# Patient Record
Sex: Male | Born: 1965 | Race: White | Hispanic: No | Marital: Married | Smoking: Former smoker
Health system: Southern US, Community
[De-identification: ages and names within clinical notes are randomized; demographics above are authoritative.]

## PROBLEM LIST (undated history)

## (undated) DIAGNOSIS — I10 Essential (primary) hypertension: Secondary | ICD-10-CM

## (undated) DIAGNOSIS — K5790 Diverticulosis of intestine, part unspecified, without perforation or abscess without bleeding: Secondary | ICD-10-CM

## (undated) DIAGNOSIS — E785 Hyperlipidemia, unspecified: Secondary | ICD-10-CM

## (undated) DIAGNOSIS — D369 Benign neoplasm, unspecified site: Secondary | ICD-10-CM

## (undated) DIAGNOSIS — T7840XA Allergy, unspecified, initial encounter: Secondary | ICD-10-CM

## (undated) DIAGNOSIS — K219 Gastro-esophageal reflux disease without esophagitis: Secondary | ICD-10-CM

## (undated) HISTORY — DX: Diverticulosis of intestine, part unspecified, without perforation or abscess without bleeding: K57.90

## (undated) HISTORY — DX: Allergy, unspecified, initial encounter: T78.40XA

## (undated) HISTORY — DX: Gastro-esophageal reflux disease without esophagitis: K21.9

## (undated) HISTORY — PX: WISDOM TOOTH EXTRACTION: SHX21

## (undated) HISTORY — DX: Hyperlipidemia, unspecified: E78.5

## (undated) HISTORY — DX: Benign neoplasm, unspecified site: D36.9

## (undated) HISTORY — DX: Essential (primary) hypertension: I10

---

## 1999-05-14 HISTORY — PX: NASAL SINUS SURGERY: SHX719

## 2000-05-26 ENCOUNTER — Encounter (INDEPENDENT_AMBULATORY_CARE_PROVIDER_SITE_OTHER): Payer: Self-pay

## 2000-05-26 ENCOUNTER — Other Ambulatory Visit: Admission: RE | Admit: 2000-05-26 | Discharge: 2000-05-26 | Payer: Self-pay | Admitting: Otolaryngology

## 2003-05-14 HISTORY — PX: BIOPSY SALIVARY GLAND: PRO30

## 2004-05-13 HISTORY — PX: OTHER SURGICAL HISTORY: SHX169

## 2004-05-16 ENCOUNTER — Ambulatory Visit: Payer: Self-pay | Admitting: Internal Medicine

## 2004-05-17 ENCOUNTER — Ambulatory Visit: Payer: Self-pay | Admitting: Internal Medicine

## 2004-05-28 ENCOUNTER — Ambulatory Visit: Payer: Self-pay

## 2004-11-01 ENCOUNTER — Ambulatory Visit: Payer: Self-pay | Admitting: Internal Medicine

## 2004-11-16 ENCOUNTER — Ambulatory Visit: Payer: Self-pay | Admitting: Internal Medicine

## 2004-11-23 ENCOUNTER — Ambulatory Visit: Payer: Self-pay | Admitting: Internal Medicine

## 2005-02-25 ENCOUNTER — Ambulatory Visit: Payer: Self-pay | Admitting: Internal Medicine

## 2005-12-11 ENCOUNTER — Ambulatory Visit: Payer: Self-pay | Admitting: Internal Medicine

## 2006-01-29 ENCOUNTER — Ambulatory Visit: Payer: Self-pay | Admitting: Internal Medicine

## 2006-05-13 DIAGNOSIS — D369 Benign neoplasm, unspecified site: Secondary | ICD-10-CM

## 2006-05-13 HISTORY — DX: Benign neoplasm, unspecified site: D36.9

## 2006-05-13 HISTORY — PX: COLONOSCOPY W/ POLYPECTOMY: SHX1380

## 2006-07-03 ENCOUNTER — Emergency Department (HOSPITAL_COMMUNITY): Admission: EM | Admit: 2006-07-03 | Discharge: 2006-07-03 | Payer: Self-pay | Admitting: Emergency Medicine

## 2006-07-18 ENCOUNTER — Ambulatory Visit: Payer: Self-pay | Admitting: Internal Medicine

## 2006-07-25 ENCOUNTER — Ambulatory Visit: Payer: Self-pay | Admitting: Internal Medicine

## 2007-03-02 DIAGNOSIS — K219 Gastro-esophageal reflux disease without esophagitis: Secondary | ICD-10-CM | POA: Insufficient documentation

## 2007-03-03 ENCOUNTER — Ambulatory Visit: Payer: Self-pay | Admitting: Internal Medicine

## 2007-03-03 DIAGNOSIS — K219 Gastro-esophageal reflux disease without esophagitis: Secondary | ICD-10-CM | POA: Insufficient documentation

## 2007-03-03 DIAGNOSIS — R0989 Other specified symptoms and signs involving the circulatory and respiratory systems: Secondary | ICD-10-CM

## 2007-03-03 DIAGNOSIS — R0609 Other forms of dyspnea: Secondary | ICD-10-CM

## 2007-03-04 ENCOUNTER — Encounter (INDEPENDENT_AMBULATORY_CARE_PROVIDER_SITE_OTHER): Payer: Self-pay | Admitting: *Deleted

## 2007-03-11 ENCOUNTER — Ambulatory Visit: Payer: Self-pay | Admitting: Internal Medicine

## 2007-03-30 ENCOUNTER — Ambulatory Visit: Payer: Self-pay | Admitting: Internal Medicine

## 2007-05-13 ENCOUNTER — Ambulatory Visit: Payer: Self-pay | Admitting: Internal Medicine

## 2007-05-21 ENCOUNTER — Ambulatory Visit: Payer: Self-pay | Admitting: Internal Medicine

## 2007-05-21 ENCOUNTER — Encounter: Payer: Self-pay | Admitting: Internal Medicine

## 2007-05-21 DIAGNOSIS — D126 Benign neoplasm of colon, unspecified: Secondary | ICD-10-CM | POA: Insufficient documentation

## 2007-05-21 DIAGNOSIS — K5730 Diverticulosis of large intestine without perforation or abscess without bleeding: Secondary | ICD-10-CM | POA: Insufficient documentation

## 2007-06-24 DIAGNOSIS — E8881 Metabolic syndrome: Secondary | ICD-10-CM | POA: Insufficient documentation

## 2007-08-04 ENCOUNTER — Telehealth (INDEPENDENT_AMBULATORY_CARE_PROVIDER_SITE_OTHER): Payer: Self-pay | Admitting: *Deleted

## 2007-08-18 ENCOUNTER — Telehealth (INDEPENDENT_AMBULATORY_CARE_PROVIDER_SITE_OTHER): Payer: Self-pay | Admitting: *Deleted

## 2008-07-13 ENCOUNTER — Telehealth (INDEPENDENT_AMBULATORY_CARE_PROVIDER_SITE_OTHER): Payer: Self-pay | Admitting: *Deleted

## 2008-07-14 ENCOUNTER — Telehealth (INDEPENDENT_AMBULATORY_CARE_PROVIDER_SITE_OTHER): Payer: Self-pay | Admitting: *Deleted

## 2008-09-21 ENCOUNTER — Encounter: Payer: Self-pay | Admitting: Internal Medicine

## 2008-10-26 ENCOUNTER — Ambulatory Visit: Payer: Self-pay | Admitting: Internal Medicine

## 2008-10-26 DIAGNOSIS — M109 Gout, unspecified: Secondary | ICD-10-CM

## 2008-10-26 DIAGNOSIS — E785 Hyperlipidemia, unspecified: Secondary | ICD-10-CM

## 2008-11-02 ENCOUNTER — Telehealth (INDEPENDENT_AMBULATORY_CARE_PROVIDER_SITE_OTHER): Payer: Self-pay | Admitting: *Deleted

## 2008-11-02 ENCOUNTER — Encounter (INDEPENDENT_AMBULATORY_CARE_PROVIDER_SITE_OTHER): Payer: Self-pay | Admitting: *Deleted

## 2008-11-07 ENCOUNTER — Ambulatory Visit: Payer: Self-pay | Admitting: Internal Medicine

## 2008-11-07 DIAGNOSIS — D649 Anemia, unspecified: Secondary | ICD-10-CM

## 2008-11-07 DIAGNOSIS — E119 Type 2 diabetes mellitus without complications: Secondary | ICD-10-CM

## 2008-12-01 ENCOUNTER — Telehealth (INDEPENDENT_AMBULATORY_CARE_PROVIDER_SITE_OTHER): Payer: Self-pay | Admitting: *Deleted

## 2009-01-06 ENCOUNTER — Ambulatory Visit: Payer: Self-pay | Admitting: Internal Medicine

## 2009-01-17 ENCOUNTER — Encounter (INDEPENDENT_AMBULATORY_CARE_PROVIDER_SITE_OTHER): Payer: Self-pay | Admitting: *Deleted

## 2009-01-17 LAB — CONVERTED CEMR LAB
Basophils Absolute: 0 10*3/uL (ref 0.0–0.1)
Cholesterol: 221 mg/dL — ABNORMAL HIGH (ref 0–200)
Direct LDL: 154.7 mg/dL
Eosinophils Absolute: 0.1 10*3/uL (ref 0.0–0.7)
HDL: 31 mg/dL — ABNORMAL LOW (ref >39.00)
Hgb A1c MFr Bld: 5.6 % (ref 4.6–6.5)
Lymphs Abs: 2.1 10*3/uL (ref 0.7–4.0)
Monocytes Absolute: 0.7 10*3/uL (ref 0.1–1.0)
Monocytes Relative: 11 % (ref 3.0–12.0)
RBC: 5.7 M/uL (ref 4.22–5.81)
Saturation Ratios: 28.9 % (ref 20.0–50.0)
Total CHOL/HDL Ratio: 7
VLDL: 59.4 mg/dL — ABNORMAL HIGH (ref 0.0–40.0)

## 2009-09-18 ENCOUNTER — Ambulatory Visit: Payer: Self-pay | Admitting: Internal Medicine

## 2009-09-19 LAB — CONVERTED CEMR LAB
BUN: 15 mg/dL (ref 6–23)
Creatinine, Ser: 1.2 mg/dL (ref 0.4–1.5)
Creatinine,U: 85.8 mg/dL
Direct LDL: 141.8 mg/dL
Microalb Creat Ratio: 0.3 mg/g (ref 0.0–30.0)
Microalb, Ur: 0.3 mg/dL (ref 0.0–1.9)
Total CHOL/HDL Ratio: 7

## 2009-10-20 ENCOUNTER — Telehealth (INDEPENDENT_AMBULATORY_CARE_PROVIDER_SITE_OTHER): Payer: Self-pay | Admitting: *Deleted

## 2010-06-10 LAB — CONVERTED CEMR LAB
Basophils Absolute: 0 10*3/uL (ref 0.0–0.1)
Bilirubin Urine: NEGATIVE
Bilirubin, Direct: 0 mg/dL (ref 0.0–0.3)
CO2: 27 meq/L (ref 19–32)
Calcium: 9.2 mg/dL (ref 8.4–10.5)
Chloride: 100 meq/L (ref 96–112)
Creatinine, Ser: 1.2 mg/dL (ref 0.4–1.5)
Eosinophils Absolute: 0.1 10*3/uL (ref 0.0–0.7)
Eosinophils Relative: 1.2 % (ref 0.0–5.0)
Eosinophils Relative: 2.4 % (ref 0.0–5.0)
Hgb A1c MFr Bld: 6.8 % — ABNORMAL HIGH (ref 4.6–6.5)
Ketones, urine, test strip: NEGATIVE
Lymphocytes Relative: 26.8 % (ref 12.0–46.0)
Lymphs Abs: 1.3 10*3/uL (ref 0.7–4.0)
MCHC: 34.2 g/dL (ref 30.0–36.0)
MCV: 87.9 fL (ref 78.0–100.0)
MCV: 91.2 fL (ref 78.0–100.0)
Monocytes Absolute: 0.4 10*3/uL (ref 0.1–1.0)
Monocytes Absolute: 0.6 10*3/uL (ref 0.2–0.7)
Monocytes Relative: 9.6 % (ref 3.0–11.0)
Neutro Abs: 3.1 10*3/uL (ref 1.4–7.7)
Neutrophils Relative %: 63.3 % (ref 43.0–77.0)
Platelets: 257 10*3/uL (ref 150.0–400.0)
Potassium: 3.9 meq/L (ref 3.5–5.1)
Protein, U semiquant: NEGATIVE
RBC: 4.19 M/uL — ABNORMAL LOW (ref 4.22–5.81)
RDW: 12.1 % (ref 11.5–14.6)
RDW: 14.3 % (ref 11.5–14.6)
TSH: 0.6 microintl units/mL (ref 0.35–5.50)
Total Protein: 7.4 g/dL (ref 6.0–8.3)
Uric Acid, Serum: 10.3 mg/dL — ABNORMAL HIGH (ref 4.0–7.8)
WBC Urine, dipstick: NEGATIVE
WBC: 4.9 10*3/uL (ref 4.5–10.5)

## 2010-06-12 NOTE — Assessment & Plan Note (Signed)
Summary: gout flare-up//lch   Vital Signs:  Patient profile:   45 year old male Weight:      250.6 pounds BMI:     34.11 Temp:     97.7 degrees F oral Pulse rate:   72 / minute Resp:     16 per minute BP sitting:   116 / 78  (left arm) Cuff size:   large  Vitals Entered By: Shonna Chock (Sep 18, 2009 8:03 AM) CC: Gout Flare-Up since last week, gout attacks coming frequently.  Fasting for any labs to be done Comments REVIEWED MED LIST, PATIENT AGREED DOSE AND INSTRUCTION CORRECT    CC:  Gout Flare-Up since last week and gout attacks coming frequently.  Fasting for any labs to be done.  History of Present Illness: Recurrent gout flares treated by Podiatrist with Daypro. Old chart reviewed : in 2002 weight was 186 & Triglycerides 98; weight now 250.6  . High Fructose Corn Syrup & uric acid  physiology reviewed . Uric acid was 9.9 &  Triglycerides were 297 in 12/2008. A1c was 6.8% in  12/2008. Glucoses not  being checked   Allergies (verified): No Known Drug Allergies  Review of Systems MS:  Complains of joint pain, joint redness, and joint swelling; L  great  toe invoved now.  Physical Exam  General:  well-nourished; alert,appropriate and cooperative throughout examination Pulses:  R and L dorsalis pedis and posterior tibial pulses are full and equal bilaterally Extremities:  No clubbing, cyanosis.L great toe slightly erythematous & tender. L foot swollen slightly. Mild chronic R great toenail changes Skin:  see foot   Impression & Recommendations:  Problem # 1:  GOUT (ICD-274.9)  Orders: Venipuncture (21308) TLB-Uric Acid, Blood (84550-URIC)  His updated medication list for this problem includes:    Colcrys 0.6 Mg Tabs (Colchicine) .Marland Kitchen... 1 two times a day until gout gone  Problem # 2:  HYPERLIPIDEMIA (ICD-272.4)  Orders: TLB-TSH (Thyroid Stimulating Hormone) (84443-TSH) TLB-Lipid Panel (80061-LIPID)  Problem # 3:  DM (ICD-250.00)  Orders: Venipuncture  (65784) TLB-Creatinine, Blood (82565-CREA) TLB-Potassium (K+) (84132-K) TLB-Uric Acid, Blood (84550-URIC) TLB-BUN (Urea Nitrogen) (84520-BUN) TLB-Microalbumin/Creat Ratio, Urine (82043-MALB) TLB-A1C / Hgb A1C (Glycohemoglobin) (83036-A1C)  Complete Medication List: 1)  Multivitamins Tabs (Multiple vitamin) .... Take 1 tablet by mouth once a day 2)  Fish Oil  .... As directed 3)  Red Yeast Rice  .... As directed 4)  Zyrtec Allergy 10 Mg Tabs (Cetirizine hcl) .Marland Kitchen.. 1 by mouth once daily as needed 5)  Colcrys 0.6 Mg Tabs (Colchicine) .Marland Kitchen.. 1 two times a day until gout gone  Patient Instructions: 1)  Check your blood sugars regularly. If your readings are usually above : 150 or below 90 you should contact our office. Two hrs post meal goal = < 180, ideally < 160. 2)  See your eye doctor yearly to check for diabetic eye damage. 3)  Check your feet each night for sore areas, calluses or signs of infection. Prescriptions: COLCRYS 0.6 MG TABS (COLCHICINE) 1 two times a day until gout gone  #30 x 0   Entered and Authorized by:   Marga Melnick MD   Signed by:   Marga Melnick MD on 09/18/2009   Method used:   Faxed to ...       [facility]  [address] [phone]* (retail)       [address]       [facility] [place], [place]  [address]       Ph: [phone] or [phone]       Fax: [phone]   RxID:   [id number]

## 2010-06-12 NOTE — Progress Notes (Signed)
Summary: Refill Request  Phone Note Refill Request Message from:  Patient on October 20, 2009 10:18 AM  Refills Requested: Medication #1:  COLCRYS 0.6 MG TABS 1 two times a day until gout gone   Dosage confirmed as above?Dosage Confirmed   Supply Requested: 1 month [facility] [address]  Next Appointment Scheduled: none Initial call taken by: Harold Barban,  October 20, 2009 10:18 AM    Prescriptions: COLCRYS 0.6 MG TABS (COLCHICINE) 1 two times a day until gout gone  #30 x 0   Entered by:   Shonna Chock   Authorized by:   Marga Melnick MD   Signed by:   Shonna Chock on 10/20/2009   Method used:   Electronically to        [address] [phone]* (retail)       [address]       [place], [place]  [address]       Ph: [phone] or [phone]       Fax: [phone]   RxID:   [id number]

## 2010-08-27 ENCOUNTER — Encounter: Payer: Self-pay | Admitting: Internal Medicine

## 2010-08-27 ENCOUNTER — Ambulatory Visit (INDEPENDENT_AMBULATORY_CARE_PROVIDER_SITE_OTHER): Payer: BLUE CROSS/BLUE SHIELD | Admitting: Internal Medicine

## 2010-08-27 ENCOUNTER — Ambulatory Visit: Payer: Self-pay | Admitting: Internal Medicine

## 2010-08-27 VITALS — BP 148/80 | HR 72 | Temp 98.4°F | Wt 243.0 lb

## 2010-08-27 DIAGNOSIS — B372 Candidiasis of skin and nail: Secondary | ICD-10-CM

## 2010-08-27 DIAGNOSIS — E8881 Metabolic syndrome: Secondary | ICD-10-CM

## 2010-08-27 MED ORDER — KETOCONAZOLE 2 % EX CREA: TOPICAL_CREAM | Freq: Two times a day (BID) | CUTANEOUS | Status: AC

## 2010-08-27 NOTE — Patient Instructions (Signed)
Keep the tissue as dry as possible. Apply the Nizoral cream twice a day and gently blow dry with a hairdryer.

## 2010-08-27 NOTE — Progress Notes (Signed)
  Subjective:    Patient ID: Stephen Randolph, male    DOB: 03/25/1966, 45 y.o.   MRN: [id number]  HPI RASH  Location: on glans penis Onset: 04/13  Course: / improving Self-treated with: no Rx             Improvement with treatment: N/A  History Pruritis: yes, intermittently  Tenderness: yes, with showering  New medications/antibiotics: no  Tick/insect/pet exposure: no  Recent travel: no  New detergent, new clothing, or other topical exposure: no   Red Flags Feeling ill: no  Fever: no  Mouth lesions: no  Facial/tongue swelling/difficulty breathing:  no  Diabetic or immunocompromised: yes, not checking Glucoses until last night  , 72 @10 :30 pm ; 87 this am      Review of Systems no iritis,  dysuria, pyuria, or hematuria, significant arthralgias     Objective:   Physical Exam he is in no acute distress.  He has no inguinal lymphadenopathy. There is a tiny granuloma in the area of the left epididymis; otherwise the scrotal exams unremarkable. There is suggestion of a candidal rash over the head of the penis.          Assessment & Plan:  #1 candidal dermatitis  #2 metabolic syndrome, rule out frank diabetes  Plan #1 topical Nizoral will be prescribed one to 2 times a day.   .  #2 A1c.

## 2010-08-28 LAB — HEMOGLOBIN A1C: Hgb A1c MFr Bld: 5.8 % (ref 4.6–6.5)

## 2010-09-25 NOTE — Assessment & Plan Note (Signed)
[facility]                         GASTROENTEROLOGY OFFICE NOTE   Stephen Randolph, Stephen Randolph               MRN:          [id number]  DATE:03/30/2007                            DOB:          [birth year]    REFERRING PHYSICIAN:  Titus Dubin. Alwyn Ren, MD,FACP,FCCP   REASON FOR CONSULTATION:  Screening colonoscopy.   HISTORY:  This is a pleasant 45 year old white male with no significant  past medical history.  He presents today regarding screening  colonoscopy.  The patient's GI history is remarkable for  gastroesophageal reflux disease.  He underwent upper endoscopy with Dr.  Russella Dar in 1994.  He was found to have grade 1 esophagitis.  For his  reflux disease, he takes Prilosec OTC 20 mg daily.  On medication, his  symptoms are well controlled.  No dysphagia.  Currently without lower GI  complaints.  No abdominal pain, change in bowel habits, or hematochezia.  His weight has been stable.  He has never had prior colonoscopy.  he has  a strong family history of colon cancer in both his father and paternal  grandfather.   PAST MEDICAL HISTORY:  1. History of dyslipidemia.  Diet controlled.  2. Osteoarthritis.  3. Anxiety.  4. Status post sinus surgery.   ALLERGIES:  No known drug allergies.   CURRENT MEDICATIONS:  1. Prozac 20 mg daily.  2. Prilosec OTC 20 mg daily/p.r.n.   FAMILY HISTORY:  Father with colon cancer at age 60.  Paternal  grandfather also with colon cancer, requiring colostomy.   SOCIAL HISTORY:  The patient is divorced, with a 69 year old son and a  53 year old Museum/gallery conservator.  He is a Engineer, agricultural.  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  Does not smoke.  Uses  alcohol socially.   REVIEW OF SYSTEMS:  Per diagnostic evaluation performed.   PHYSICAL EXAMINATION:  GENERAL:  Well-appearing male, in no acute  distress.  VITAL SIGNS:  Blood pressure 152/82, heart rate 88 and regular, weight  243.4  pounds.  He is 6 feet in height.  HEENT:  Sclerae are anicteric.  Conjunctivae are pink.  Oral mucosa is  intact.  NECK:  There is no adenopathy.  LUNGS:  Clear.  HEART:  Regular.  ABDOMEN:  Soft, without tenderness, mass, or hernia.  Good bowel sounds.   IMPRESSION:  1. Gastroesophageal reflux disease, with a history of mild      esophagitis.  Currently asymptomatic on Prilosec.  2. Family history of colon cancer in first and second-degree      relatives.  He is an appropriate candidate for colonoscopy without      contraindication.   RECOMMENDATIONS:  1. Continue Prilosec.  2. Colonoscopy with polypectomy if necessary.  The nature of the      procedure, as well as the risks, benefits, and alternatives, have      been reviewed.  He understood and agreed to proceed.  3. Ongoing general medical care with Dr. Alwyn Ren.     Wilhemina Bonito. Marina Goodell, MD  Electronically Signed    JNP/MedQ  DD: 03/30/2007  DT: 03/31/2007  Job #: [id number]  cc:   Titus Dubin. Alwyn Ren, MD,FACP,FCCP

## 2010-09-28 NOTE — Assessment & Plan Note (Signed)
[facility]                        [place] OFFICE NOTE   DETRELL, UMSCHEID               MRN:          [id number]  DATE:07/18/2006                            DOB:          1965-10-30    Stephen Randolph was seen July 18, 2006, for followup evaluation,  shortness of breath and chest pressure.  He had been seen in the urgent  care and then taken by EMS to the hospital.  Those hospital records were  reviewed.  Electrocardiogram and chest x-ray were negative.  Cardiac  enzymes, D-dimer and chemistries were also normal.   There was a minimal irregularity along the right anterior fourth rib,  suggestion of an old injury or fracture.  Lab abnormalities included a  hemoglobin of 17.3 and a potassium of 3.2.   In reviewing the history, he denies headaches or diarrhea with the chest  pain.  He has had occasional flushing.   He feels that the symptoms are related to anxiety.  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   He was given Xanax by his wife and took one half pill on two occasions  with response.   FAMILY HISTORY:  Does include anxiety in an aunt and his mother.   He is on fish oil, red yeast rice and multivitamins.  He does have  dyslipidemia and risk and interventions have been discussed.   Weight is 221, down 9 pounds.  O2 sats were 95 and 96% on room air.  Pulse was 76 and regular.  Respiratory rate was 15 and unlabored.  Blood  pressure 126/80.  He does have some plethora of the face, which suggests  solar changes.   There were no carotid bruits.   He has an S4 with slurring, but no significant murmurs.   Chest was clear to auscultation.   Thyroid is normal to palpation.   Deep tendon reflexes are normal.   He is oriented times three and affect is appropriate.  There are no  neuropsychiatric deficits.   The physiology of neurotransmitters  and their impact were reviewed.  It  was recommended that he take fluoxetine 10 mg daily, increasing this to  20 if there is suboptimal response.   He is not on a diuretic.  It is recommended that he use no salt to  season food and increase his citrus intake because of his potassium of  3.2.   His hemoglobin was 17.6 on January 29, 2006.  A ferritin level will be  checked to rule out hemochromatosis.   He will also be given a urine collection kit.  Should he have additional  symptoms, then a 24-hour urine for catecholamines and metanephrines will  be collected.   Buspirone 15 mg one half to one twice a day as needed would also be  recommended.  This would not be associated with the addiction potential  of alprazolam.   He will be re-evaluated after four to six weeks of these interventions.     Titus Dubin. Alwyn Ren, MD,FACP,FCCP  Electronically Signed    [facility]/MedQ  DD: 07/18/2006  DT: 07/18/2006  Job #: [id number]

## 2010-11-08 ENCOUNTER — Ambulatory Visit (INDEPENDENT_AMBULATORY_CARE_PROVIDER_SITE_OTHER): Payer: BC Managed Care – PPO | Admitting: Internal Medicine

## 2010-11-08 ENCOUNTER — Encounter: Payer: Self-pay | Admitting: Internal Medicine

## 2010-11-08 DIAGNOSIS — Z0000 Encounter for general adult medical examination without abnormal findings: Secondary | ICD-10-CM

## 2010-11-08 DIAGNOSIS — E785 Hyperlipidemia, unspecified: Secondary | ICD-10-CM

## 2010-11-08 DIAGNOSIS — E8881 Metabolic syndrome: Secondary | ICD-10-CM

## 2010-11-08 DIAGNOSIS — R7309 Other abnormal glucose: Secondary | ICD-10-CM

## 2010-11-08 NOTE — Progress Notes (Signed)
Subjective:    Patient ID: Stephen Randolph, male    DOB: July 02, 1965, 45 y.o.   MRN: [id number]  HPI  Stephen Randolph  is here for a physical; he has no acute issues except for recent ?allergy related headaches. Allegra helps.      Review of Systems Patient reports no  vision/ hearing changes,anorexia, weight change, fever ,adenopathy, persistant / recurrent hoarseness, swallowing issues, chest pain,palpitations, edema,persistant / recurrent cough, hemoptysis, dyspnea(rest, exertional, paroxysmal nocturnal), gastrointestinal  bleeding (melena, rectal bleeding), abdominal pain, excessive heart burn, GU symptoms( dysuria, hematuria, pyuria, voiding/incontinence  issues) syncope, focal weakness, memory loss, skin/hair/nail changes,depression, anxiety, abnormal bruising/bleeding, musculoskeletal symptoms/signs.  Headaches are  R frontal with temporal radiation. Snoring has been described w/o apnea. Occasional am numbness of hands     Objective:   Physical Exam Gen.: Healthy and well-nourished in appearance. Alert, appropriate and cooperative throughout exam. Head: Normocephalic without obvious abnormalities;  no alopecia  Eyes: No corneal or conjunctival inflammation noted. Pupils equal round reactive to light and accommodation. Fundal exam is benign without hemorrhages, exudate, papilledema. Extraocular motion intact. Vision grossly normal. Ears: External  ear exam reveals no significant lesions or deformities. Wax R canal  L .TM  normal. Hearing is grossly normal bilaterally. Nose: External nasal exam reveals no deformity or inflammation. Nasal mucosa are pink and moist. No lesions or exudates noted. Septum normal Mouth: Oral mucosa and oropharynx reveal no lesions or exudates. Teeth in good repair. Neck: No deformities, masses, or tenderness noted. Range of motion & . Thyroid normal. Lungs: Normal respiratory effort; chest expands symmetrically. Lungs are clear to auscultation without rales,  wheezes, or increased work of breathing. Heart: Normal rate and rhythm. Normal S1 and S2. No gallop, click, or rub. No  murmur. Abdomen: Bowel sounds normal; abdomen soft and nontender. No masses, organomegaly or hernias noted. Genitalia/ DRE:  Normal.                                                                                 Musculoskeletal/extremities: No deformity or scoliosis noted of  the thoracic or lumbar spine. No clubbing, cyanosis, edema, or deformity noted. Range of motion  normal .Tone & strength  normal.Joints normal. Nail health  good. Vascular: Carotid, radial artery, dorsalis pedis and dorsalis posterior tibial pulses are full and equal. No bruits present. Neurologic: Alert and oriented x3. Deep tendon reflexes symmetrical and normal.          Skin: Intact without suspicious lesions or rashes. Tiny skin tag perineum. Lymph: No cervical, axillary, or inguinal lymphadenopathy present. Psych: Mood and affect are normal. Normally interactive                                                                                         Assessment & Plan:  #1 comprehensive physical exam; no acute findings #  2 see Problem List with Assessments & Recommendations Plan: see Orders

## 2010-11-08 NOTE — Patient Instructions (Signed)
Preventive Health Care: Exercise at least 30-45 minutes a day,  3-4 days a week.  Eat a low-fat diet with lots of fruits and vegetables, up to 7-9 servings per day. Avoid obesity; your goal is waist measurement < 40 inches.Consume less than 40 grams of sugar per day from foods & drinks with High Fructose Corn Sugar as #1, 2,3 or # 4 on label. Eye Doctor - have an eye exam @ least annually.                                                        Health Care Power of Attorney & Living Will. Complete if not in place ; these place you in charge of your health care decisions.

## 2010-11-28 ENCOUNTER — Encounter: Payer: Self-pay | Admitting: Internal Medicine

## 2011-09-12 ENCOUNTER — Ambulatory Visit (INDEPENDENT_AMBULATORY_CARE_PROVIDER_SITE_OTHER): Payer: BC Managed Care – PPO | Admitting: Internal Medicine

## 2011-09-12 ENCOUNTER — Encounter: Payer: Self-pay | Admitting: Internal Medicine

## 2011-09-12 DIAGNOSIS — M109 Gout, unspecified: Secondary | ICD-10-CM

## 2011-09-12 DIAGNOSIS — E790 Hyperuricemia without signs of inflammatory arthritis and tophaceous disease: Secondary | ICD-10-CM

## 2011-09-12 DIAGNOSIS — R7989 Other specified abnormal findings of blood chemistry: Secondary | ICD-10-CM

## 2011-09-12 MED ORDER — FEBUXOSTAT 40 MG PO TABS: 40.0000 mg | ORAL_TABLET | Freq: Every day | ORAL | Status: DC

## 2011-09-12 NOTE — Progress Notes (Signed)
  Subjective:    Patient ID: Stephen Randolph, male    DOB: [birth year], 46 y.o.   MRN: [id number]  HPI  He has had intermittent gout attacks over the past 3 years. He had a significant episode one month ago which lasted approximately 2 weeks. Daypro was of no benefit but  Uloric  samples seem to help. He did not take colchicine for that episode but did take it in January. He did not take colchicine because he feared long-term complications.  He states that he is restricting high fructose corn syrup sugar in his diet. He is not on hydrochlorothiazide    Review of Systems He has essentially stopped drinking as part of his gout prevention program. He avoids shellfish and organ meats.     Objective:   Physical Exam   He is healthy and well-nourished in appearance and in no acute distress.  Joints reveal no erythema or swelling. He has full range of motion. Tone and strength are good. He does have some crepitus of his knees.  Pedal pulses are intact. There are no skin changes. He has some chronic changes of the right great toenail        Assessment & Plan:  #1 gout, quiescent. Uloric prophylaxis appears appropriate. Uric acid has been drawn.

## 2011-09-12 NOTE — Patient Instructions (Signed)
The most common cause of elevated uric acid is the ingestion of sugar from high fructose corn syrup sources added to processed foods & drinks.  Eat a low-fat diet with lots of fruits and vegetables, up to 7-9 servings per day. Consume less than 40 (preferably ZERO) grams of sugar per day from foods & drinks with High Fructose Corn Syrup (HFCS) sugar as #1,2,3 or # 4 on label.Whole Foods, [employer] & [employer] do not carry products with HFCS. Follow a  low carb nutrition program such as [place] or The New Sugar Busters  to prevent Diabetes progression . White carbohydrates (potatoes, rice, bread, and pasta) have a high spike of sugar and a high load of sugar. For example a  baked potato has a cup of sugar and a  french fry  2 teaspoons of sugar. Yams, wild  rice, whole grained bread &  wheat pasta have been much lower spike and load of  sugar. Portions should be the size of a deck of cards or your palm.  Please try to go on My Chart within the next 24 hours to allow me to release the results directly to you.

## 2011-09-13 LAB — URIC ACID: Uric Acid, Serum: 6.2 mg/dL (ref 4.0–7.8)

## 2011-10-11 ENCOUNTER — Other Ambulatory Visit: Payer: Self-pay

## 2011-10-11 MED ORDER — FEBUXOSTAT 40 MG PO TABS: 40.0000 mg | ORAL_TABLET | Freq: Every day | ORAL | Status: DC

## 2011-10-11 MED ORDER — ALLOPURINOL 100 MG PO TABS: 100.0000 mg | ORAL_TABLET | Freq: Every day | ORAL | Status: DC

## 2011-10-11 NOTE — Telephone Encounter (Signed)
This patient had never been on Allopurinol in the past, please advise on the dose. Thank You    KP

## 2011-10-11 NOTE — Telephone Encounter (Signed)
Ok for 30 of each w/ 1 refill

## 2011-10-11 NOTE — Telephone Encounter (Signed)
Call from patient and he stated he has been taking the Uloric or his gout by was only given a 1 mo supply of samples. He wanted to know if he could get a script sent to the pharmacy and also a script for Allopurinol to compare the prices. Please advise      KP

## 2011-10-11 NOTE — Telephone Encounter (Signed)
Allopurinol 100mg  daily to start- Dr Laury Axon will increase as needed

## 2011-10-11 NOTE — Telephone Encounter (Signed)
Patient made aware that both medications were sent.     KP

## 2011-12-09 ENCOUNTER — Other Ambulatory Visit: Payer: Self-pay | Admitting: Family Medicine

## 2011-12-13 ENCOUNTER — Encounter: Payer: BC Managed Care – PPO | Admitting: Internal Medicine

## 2012-03-05 ENCOUNTER — Encounter: Payer: Self-pay | Admitting: Internal Medicine

## 2012-03-05 ENCOUNTER — Ambulatory Visit (INDEPENDENT_AMBULATORY_CARE_PROVIDER_SITE_OTHER): Payer: BC Managed Care – PPO | Admitting: Internal Medicine

## 2012-03-05 VITALS — BP 122/80 | HR 79 | Temp 97.9°F | Resp 12 | Ht 72.5 in | Wt 250.0 lb

## 2012-03-05 DIAGNOSIS — E785 Hyperlipidemia, unspecified: Secondary | ICD-10-CM

## 2012-03-05 DIAGNOSIS — Z0000 Encounter for general adult medical examination without abnormal findings: Secondary | ICD-10-CM

## 2012-03-05 DIAGNOSIS — J310 Chronic rhinitis: Secondary | ICD-10-CM

## 2012-03-05 DIAGNOSIS — M109 Gout, unspecified: Secondary | ICD-10-CM

## 2012-03-05 DIAGNOSIS — H109 Unspecified conjunctivitis: Secondary | ICD-10-CM

## 2012-03-05 DIAGNOSIS — D126 Benign neoplasm of colon, unspecified: Secondary | ICD-10-CM

## 2012-03-05 DIAGNOSIS — R9431 Abnormal electrocardiogram [ECG] [EKG]: Secondary | ICD-10-CM

## 2012-03-05 LAB — CBC WITH DIFFERENTIAL/PLATELET
Eosinophils Relative: 1.1 % (ref 0.0–5.0)
HCT: 52.7 % — ABNORMAL HIGH (ref 39.0–52.0)
Hemoglobin: 17.8 g/dL — ABNORMAL HIGH (ref 13.0–17.0)
Lymphocytes Relative: 27.4 % (ref 12.0–46.0)
MCHC: 33.8 g/dL (ref 30.0–36.0)
MCV: 90.1 fl (ref 78.0–100.0)
Monocytes Absolute: 0.6 10*3/uL (ref 0.1–1.0)
Neutro Abs: 3.4 10*3/uL (ref 1.4–7.7)
Neutrophils Relative %: 59.9 % (ref 43.0–77.0)

## 2012-03-05 NOTE — Progress Notes (Signed)
  Subjective:    Patient ID: Stephen Randolph, male    DOB: 02-19-1966, 46 y.o.   MRN: [id number]  HPI  Stephen Randolph is here for a physical;acute issues relate to gout. His orthopedist has increased his allopurinol on attempt to control musculoskeletal symptoms attributed to chronic gout. Past medical history/family history/social history were all reviewed and updated.       Review of Systems He is not on a regular exercise program because of recent gout-related issues. Diet consists of smaller portions and avoidance of high fructose corn syrup. He does have dyspnea and high levels of exercise; he denies chest pain, palpitations, or claudication. He monitors his sugars irregularly ; random sugars have been 75-80.     Objective:   Physical Exam Gen.: Weight excess centrally. Alert, appropriate and cooperative throughout exam. Head: Normocephalic without obvious abnormalities  Eyes: No corneal or conjunctival inflammation noted. Pupils equal round reactive to light and accommodation. Fundal exam is benign without hemorrhages, exudate, papilledema. Extraocular motion intact. Vision grossly normal. Ears: External  ear exam reveals no significant lesions or deformities. Canals : wax bilaterally. Hearing is grossly normal bilaterally. Nose: External nasal exam reveals no deformity or inflammation. Nasal mucosa are pink and moist. No lesions or exudates noted.  Mouth: Oral mucosa and oropharynx reveal no lesions or exudates. Teeth in good repair. Neck: No deformities, masses, or tenderness noted. Range of motion & Thyroid normal. Lungs: Normal respiratory effort; chest expands symmetrically. Lungs are clear to auscultation without rales, wheezes, or increased work of breathing. Heart: Normal rate and rhythm. Normal S1 and S2. No gallop, click, or rub. Grade 1/2 over 6 systolic murmur . Abdomen: Bowel sounds normal; abdomen soft and nontender. No masses, organomegaly or hernias  noted. Genitalia/DRE: Genitalia normal . Prostate is normal without enlargement, asymmetry, nodularity, or induration.                                    Musculoskeletal/extremities: No deformity or scoliosis noted of  the thoracic or lumbar spine. No clubbing, cyanosis, edema, or deformity noted. Range of motion  normal .Tone & strength  normal.Joints normal. Nail health  good. Vascular: Carotid, radial artery, dorsalis pedis and  posterior tibial pulses are full and equal. No bruits present. Neurologic: Alert and oriented x3. Deep tendon reflexes symmetrical and normal.          Skin: Intact without suspicious lesions or rashes. Lymph: No cervical, axillary, or inguinal lymphadenopathy present. Psych: Mood and affect are normal. Normally interactive                                                                                         Assessment & Plan:  #1 comprehensive physical exam; no acute findings  Plan: see Orders

## 2012-03-05 NOTE — Patient Instructions (Addendum)
Preventive Health Care: Exercise at least 30-45 minutes a day,  3-4 days a week.  Eat a low-fat diet with lots of fruits and vegetables, up to 7-9 servings per day.  Consume less than 40 grams of sugar per day from foods & drinks with High Fructose Corn Sugar as #1,2,3 or # 4 on label. As per the Standard of Care , screening Colonoscopy recommended this year. Eye Doctor - have an eye exam @ least annually.                                                         Health Care Power of Attorney & Living Will. Complete if not in place ; these place you in charge of your health care decisions. Take the EKG to any emergency room or preop visits. There are nonspecific changes; as long as there is no new change these are not clinically significant.  If you activate My Chart; the results can be released to you as soon as they populate from the lab. If you choose not to use this program; the labs have to be reviewed, copied & mailed   causing a delay in getting the results to you.

## 2012-03-06 LAB — HEPATIC FUNCTION PANEL
AST: 69 U/L — ABNORMAL HIGH (ref 0–37)
Albumin: 4.4 g/dL (ref 3.5–5.2)
Total Bilirubin: 1 mg/dL (ref 0.3–1.2)
Total Protein: 7.8 g/dL (ref 6.0–8.3)

## 2012-03-06 LAB — BASIC METABOLIC PANEL
CO2: 23 mEq/L (ref 19–32)
Calcium: 9.4 mg/dL (ref 8.4–10.5)
Creatinine, Ser: 1.3 mg/dL (ref 0.4–1.5)
GFR: 62.29 mL/min (ref >60.00)
Glucose, Bld: 96 mg/dL (ref 70–99)
Potassium: 3.9 mEq/L (ref 3.5–5.1)
Sodium: 138 mEq/L (ref 135–145)

## 2012-03-06 LAB — MICROALBUMIN / CREATININE URINE RATIO
Creatinine,U: 43 mg/dL
Microalb, Ur: 1.3 mg/dL (ref 0.0–1.9)

## 2012-03-06 LAB — TSH: TSH: 0.82 u[IU]/mL (ref 0.35–5.50)

## 2012-03-06 LAB — LIPID PANEL
Cholesterol: 229 mg/dL — ABNORMAL HIGH (ref 0–200)
Triglycerides: 227 mg/dL — ABNORMAL HIGH (ref 0.0–149.0)

## 2012-03-06 LAB — URIC ACID: Uric Acid, Serum: 9.5 mg/dL — ABNORMAL HIGH (ref 4.0–7.8)

## 2012-03-06 LAB — LDL CHOLESTEROL, DIRECT: Direct LDL: 171.3 mg/dL

## 2012-06-22 ENCOUNTER — Encounter: Payer: Self-pay | Admitting: Internal Medicine

## 2012-11-19 ENCOUNTER — Encounter: Payer: Self-pay | Admitting: Internal Medicine

## 2013-05-13 HISTORY — PX: COLONOSCOPY: SHX174

## 2013-07-29 ENCOUNTER — Encounter: Payer: Self-pay | Admitting: Internal Medicine

## 2013-08-02 ENCOUNTER — Encounter: Payer: Self-pay | Admitting: Internal Medicine

## 2013-08-02 ENCOUNTER — Ambulatory Visit (INDEPENDENT_AMBULATORY_CARE_PROVIDER_SITE_OTHER): Payer: 59 | Admitting: Internal Medicine

## 2013-08-02 VITALS — BP 140/90 | HR 91 | Temp 97.4°F | Resp 14 | Ht 71.5 in | Wt 247.4 lb

## 2013-08-02 DIAGNOSIS — E785 Hyperlipidemia, unspecified: Secondary | ICD-10-CM

## 2013-08-02 DIAGNOSIS — Z0000 Encounter for general adult medical examination without abnormal findings: Secondary | ICD-10-CM

## 2013-08-02 DIAGNOSIS — E669 Obesity, unspecified: Secondary | ICD-10-CM | POA: Insufficient documentation

## 2013-08-02 DIAGNOSIS — M109 Gout, unspecified: Secondary | ICD-10-CM

## 2013-08-02 NOTE — Patient Instructions (Signed)

## 2013-08-02 NOTE — Progress Notes (Signed)
   Subjective:    Patient ID: Stephen Randolph, male    DOB: [birth year], 48 y.o.   MRN: [id number]  HPI  He is here for a physical;acute issues include gout attack 1 month ago.Allopurinol 300 mg restarted after Colcrys course.     Review of Systems  A heart healthy diet is followed; exercise is irregular. .  Family history is + for premature coronary disease in MGF Advanced cholesterol testing reveals  LDL goal is less than 100; ideally <70. Presently not on statin.  Low dose ASA not taken Specifically denied are  chest pain, palpitations, dyspnea, or claudication.      Objective:   Physical Exam Gen.: Healthy and well-nourished in appearance. Alert, appropriate and cooperative throughout exam. Head: Normocephalic without obvious abnormalities; no alopecia  Eyes: No corneal or conjunctival inflammation noted. Pupils equal round reactive to light and accommodation. Extraocular motion intact.  Ears: External  ear exam reveals no significant lesions or deformities. Canals clear .TMs normal. Hearing is grossly normal bilaterally. Nose: External nasal exam reveals no deformity or inflammation. Nasal mucosa are pink and moist. No lesions or exudates noted.   Mouth: Oral mucosa and oropharynx reveal no lesions or exudates. Teeth in good repair. Neck: No deformities, masses, or tenderness noted. Range of motion & Thyroid normal. Lungs: Normal respiratory effort; chest expands symmetrically. Lungs are clear to auscultation without rales, wheezes, or increased work of breathing. Heart: Normal rate and rhythm. Normal S1 and S2. No gallop, click, or rub. No murmur. Abdomen: Bowel sounds normal; abdomen soft and nontender. No masses, organomegaly or hernias noted. Genitalia: Genitalia normal except for left varices. Prostate is normal without enlargement, asymmetry, nodularity, or induration                                   Musculoskeletal/extremities: No deformity or scoliosis noted of  the  thoracic or lumbar spine.No clubbing, cyanosis, edema, or significant extremity  deformity noted. Range of motion normal .Tone & strength normal. Hand joints normal.  Fingernail health good. Able to lie down & sit up w/o help. Negative SLR bilaterally Vascular: Carotid, radial artery, dorsalis pedis and  posterior tibial pulses are full and equal. No bruits present. Neurologic: Alert and oriented x3. Deep tendon reflexes symmetrical and normal.  Gait normal        Skin: Intact without suspicious lesions or rashes. Lymph: No cervical, axillary, or inguinal lymphadenopathy present. Psych: Mood and affect are normal. Normally interactive                                                                                        Assessment & Plan:  #1 comprehensive physical exam; no acute findings  Plan: see Orders  & Recommendations

## 2013-08-02 NOTE — Progress Notes (Signed)
Pre visit review using our clinic review tool, if applicable. No additional management support is needed unless otherwise documented below in the visit note. 

## 2013-08-03 ENCOUNTER — Other Ambulatory Visit (INDEPENDENT_AMBULATORY_CARE_PROVIDER_SITE_OTHER): Payer: 59

## 2013-08-03 DIAGNOSIS — Z0000 Encounter for general adult medical examination without abnormal findings: Secondary | ICD-10-CM

## 2013-08-03 LAB — BASIC METABOLIC PANEL
BUN: 15 mg/dL (ref 6–23)
CHLORIDE: 100 meq/L (ref 96–112)
CO2: 29 meq/L (ref 19–32)
CREATININE: 1.3 mg/dL (ref 0.4–1.5)
Calcium: 9.4 mg/dL (ref 8.4–10.5)
GFR: 64.14 mL/min (ref >60.00)
GLUCOSE: 109 mg/dL — AB (ref 70–99)
POTASSIUM: 4 meq/L (ref 3.5–5.1)
Sodium: 137 mEq/L (ref 135–145)

## 2013-08-03 LAB — LIPID PANEL
CHOLESTEROL: 190 mg/dL (ref 0–200)
HDL: 34.5 mg/dL — AB (ref >39.00)
LDL Cholesterol: 114 mg/dL — ABNORMAL HIGH (ref 0–99)
TRIGLYCERIDES: 206 mg/dL — AB (ref 0.0–149.0)
Total CHOL/HDL Ratio: 6
VLDL: 41.2 mg/dL — AB (ref 0.0–40.0)

## 2013-08-03 LAB — CBC WITH DIFFERENTIAL/PLATELET
Basophils Absolute: 0 10*3/uL (ref 0.0–0.1)
Basophils Relative: 0.7 % (ref 0.0–3.0)
EOS ABS: 0.1 10*3/uL (ref 0.0–0.7)
Eosinophils Relative: 2.5 % (ref 0.0–5.0)
HEMATOCRIT: 51.1 % (ref 39.0–52.0)
Hemoglobin: 17.3 g/dL — ABNORMAL HIGH (ref 13.0–17.0)
Lymphocytes Relative: 32.9 % (ref 12.0–46.0)
Lymphs Abs: 2 10*3/uL (ref 0.7–4.0)
MCHC: 33.9 g/dL (ref 30.0–36.0)
MCV: 90.1 fl (ref 78.0–100.0)
MONO ABS: 0.6 10*3/uL (ref 0.1–1.0)
Monocytes Relative: 9.5 % (ref 3.0–12.0)
NEUTROS PCT: 54.4 % (ref 43.0–77.0)
Neutro Abs: 3.3 10*3/uL (ref 1.4–7.7)
PLATELETS: 317 10*3/uL (ref 150.0–400.0)
RBC: 5.67 Mil/uL (ref 4.22–5.81)
RDW: 12.8 % (ref 11.5–14.6)
WBC: 6 10*3/uL (ref 4.5–10.5)

## 2013-08-03 LAB — URIC ACID: Uric Acid, Serum: 5 mg/dL (ref 4.0–7.8)

## 2013-08-03 LAB — HEPATIC FUNCTION PANEL
ALBUMIN: 4.4 g/dL (ref 3.5–5.2)
ALT: 59 U/L — ABNORMAL HIGH (ref 0–53)
AST: 41 U/L — ABNORMAL HIGH (ref 0–37)
Alkaline Phosphatase: 74 U/L (ref 39–117)
Bilirubin, Direct: 0.1 mg/dL (ref 0.0–0.3)
TOTAL PROTEIN: 7.5 g/dL (ref 6.0–8.3)
Total Bilirubin: 0.7 mg/dL (ref 0.3–1.2)

## 2013-08-03 LAB — TSH: TSH: 0.82 u[IU]/mL (ref 0.35–5.50)

## 2013-08-03 LAB — HEMOGLOBIN A1C: HEMOGLOBIN A1C: 5.7 % (ref 4.6–6.5)

## 2013-09-02 ENCOUNTER — Ambulatory Visit (AMBULATORY_SURGERY_CENTER): Payer: Self-pay

## 2013-09-02 VITALS — Ht 72.0 in | Wt 246.8 lb

## 2013-09-02 DIAGNOSIS — Z800 Family history of malignant neoplasm of digestive organs: Secondary | ICD-10-CM

## 2013-09-02 DIAGNOSIS — Z86010 Personal history of colonic polyps: Secondary | ICD-10-CM

## 2013-09-02 MED ORDER — MOVIPREP 100 G PO SOLR: ORAL | Status: DC

## 2013-09-02 NOTE — Progress Notes (Signed)
Per pt, no allergies to soy or egg products.Pt not taking any weight loss meds or using  O2 at home. 

## 2013-09-08 ENCOUNTER — Encounter: Payer: Self-pay | Admitting: Internal Medicine

## 2013-09-16 ENCOUNTER — Ambulatory Visit (AMBULATORY_SURGERY_CENTER): Payer: 59 | Admitting: Internal Medicine

## 2013-09-16 ENCOUNTER — Encounter: Payer: Self-pay | Admitting: Internal Medicine

## 2013-09-16 VITALS — BP 133/77 | HR 66 | Temp 97.6°F | Resp 12 | Ht 72.0 in | Wt 246.0 lb

## 2013-09-16 DIAGNOSIS — D126 Benign neoplasm of colon, unspecified: Secondary | ICD-10-CM

## 2013-09-16 DIAGNOSIS — Z86010 Personal history of colonic polyps: Secondary | ICD-10-CM

## 2013-09-16 MED ORDER — SODIUM CHLORIDE 0.9 % IV SOLN: 500.0000 mL | INTRAVENOUS | Status: DC

## 2013-09-16 NOTE — Progress Notes (Signed)
Called to room to assist during endoscopic procedure.  Patient ID and intended procedure confirmed with present staff. Received instructions for my participation in the procedure from the performing physician.  

## 2013-09-16 NOTE — Op Note (Signed)
[facility]  [address] [place], [id number]   COLONOSCOPY PROCEDURE REPORT  PATIENT: Stephen Randolph, Stephen Randolph  MR#: [id number] BIRTHDATE: [birth year] , 47  yrs. old GENDER: Male ENDOSCOPIST: Eustace Quail, MD REFERRED EH:[id number] Program Recall PROCEDURE DATE:  09/16/2013 PROCEDURE:   Colonoscopy with snare polypectomy x3 First Screening Colonoscopy - Avg.  risk and is 50 yrs.  old or older - No.  Prior Negative Screening - Now for repeat screening. N/A  History of Adenoma - Now for follow-up colonoscopy & has been > or = to 3 yrs.  Yes hx of adenoma.  Has been 3 or more years since last colonoscopy.  Polyps Removed Today? Yes. ASA CLASS:   Class II INDICATIONS:Patient's  family history of colon cancer (father 75s, paternal grandfather) and Patient's personal history of adenomatous colon polyps -index exam January 2009 with small tubular adenomas and hyperplastic polyp. MEDICATIONS: MAC sedation, administered by CRNA and propofol (Diprivan) 400mg  IV  DESCRIPTION OF PROCEDURE:   After the risks benefits and alternatives of the procedure were thoroughly explained, informed consent was obtained.  A digital rectal exam revealed no abnormalities of the rectum.   The LB [id number]  endoscope was introduced through the anus and advanced to the cecum, which was identified by both the appendix and ileocecal valve. No adverse events experienced.   The quality of the prep was excellent, using MoviPrep  The instrument was then slowly withdrawn as the colon was fully examined.  COLON FINDINGS: Three polyps ranging between 3-68mm in size were found in the ascending colon.  A polypectomy was performed with a cold snare.  The resection was complete and the polyp tissue was completely retrieved.   Moderate diverticulosis was noted throughout the colon.   The colon mucosa was otherwise normal. Retroflexed views revealed no abnormalities. The time to cecum=2 minutes  21 seconds.  Withdrawal time=12 minutes 25 seconds.  The scope was withdrawn and the procedure completed. COMPLICATIONS: There were no complications.  ENDOSCOPIC IMPRESSION: 1.   Three small polyps  were found in the ascending colon; polypectomy was performed with a cold snare 2.   Moderate diverticulosis was noted throughout the entire examined colon 3.   The colon mucosa was otherwise normal RECOMMENDATIONS: 1. Follow up colonoscopy in 5 years   eSigned:  Eustace Quail, MD 09/16/2013 10:57 AM   cc: Hendricks Limes, MD and The Patient

## 2013-09-16 NOTE — Progress Notes (Signed)
No complaints noted in the recovery room. maw

## 2013-09-16 NOTE — Patient Instructions (Signed)
YOU HAD AN ENDOSCOPIC PROCEDURE TODAY AT THE [facility]: Refer to the procedure report that was given to you for any specific questions about what was found during the examination.  If the procedure report does not answer your questions, please call your gastroenterologist to clarify.  If you requested that your care partner not be given the details of your procedure findings, then the procedure report has been included in a sealed envelope for you to review at your convenience later.  YOU SHOULD EXPECT: Some feelings of bloating in the abdomen. Passage of more gas than usual.  Walking can help get rid of the air that was put into your GI tract during the procedure and reduce the bloating. If you had a lower endoscopy (such as a colonoscopy or flexible sigmoidoscopy) you may notice spotting of blood in your stool or on the toilet paper. If you underwent a bowel prep for your procedure, then you may not have a normal bowel movement for a few days.  DIET: Your first meal following the procedure should be a light meal and then it is ok to progress to your normal diet.  A half-sandwich or bowl of soup is an example of a good first meal.  Heavy or fried foods are harder to digest and may make you feel nauseous or bloated.  Likewise meals heavy in dairy and vegetables can cause extra gas to form and this can also increase the bloating.  Drink plenty of fluids but you should avoid alcoholic beverages for 24 hours.  ACTIVITY: Your care partner should take you home directly after the procedure.  You should plan to take it easy, moving slowly for the rest of the day.  You can resume normal activity the day after the procedure however you should NOT DRIVE or use heavy machinery for 24 hours (because of the sedation medicines used during the test).    SYMPTOMS TO REPORT IMMEDIATELY: A gastroenterologist can be reached at any hour.  During normal business hours, 8:30 AM to 5:00 PM Monday through Friday,  call [phone].  After hours and on weekends, please call the GI answering service at [phone] who will take a message and have the physician on call contact you.   Following lower endoscopy (colonoscopy or flexible sigmoidoscopy):  Excessive amounts of blood in the stool  Significant tenderness or worsening of abdominal pains  Swelling of the abdomen that is new, acute  Fever of 100F or higher    FOLLOW UP: If any biopsies were taken you will be contacted by phone or by letter within the next 1-3 weeks.  Call your gastroenterologist if you have not heard about the biopsies in 3 weeks.  Our staff will call the home number listed on your records the next business day following your procedure to check on you and address any questions or concerns that you may have at that time regarding the information given to you following your procedure. This is a courtesy call and so if there is no answer at the home number and we have not heard from you through the emergency physician on call, we will assume that you have returned to your regular daily activities without incident.  SIGNATURES/CONFIDENTIALITY: You and/or your care partner have signed paperwork which will be entered into your electronic medical record.  These signatures attest to the fact that that the information above on your After Visit Summary has been reviewed and is understood.  Full responsibility of the confidentiality   information lies with you and/or your care-partner.    Handouts were given to your care partner on polyps, diverticulosis and a high fiber diet with liberal fluid intake. You may resume your current medications today. Await biopsy results. Please call if any questions or concerns.   

## 2013-09-17 ENCOUNTER — Telehealth: Payer: Self-pay

## 2013-09-17 NOTE — Telephone Encounter (Signed)
Left message on answering machine. 

## 2013-09-21 ENCOUNTER — Encounter: Payer: Self-pay | Admitting: Internal Medicine

## 2013-11-18 ENCOUNTER — Telehealth: Payer: Self-pay

## 2013-11-18 ENCOUNTER — Encounter: Payer: Self-pay | Admitting: Internal Medicine

## 2013-11-18 MED ORDER — ALLOPURINOL 300 MG PO TABS: 300.0000 mg | ORAL_TABLET | Freq: Every day | ORAL | Status: DC

## 2013-11-18 NOTE — Telephone Encounter (Signed)
Diabetic bundle- a1c and bmet need to be ordered Sent pt a message through [place] informing him to schedule a follow up appointment concerning diabetes and cholesterol

## 2014-02-25 ENCOUNTER — Other Ambulatory Visit: Payer: Self-pay

## 2014-06-20 ENCOUNTER — Ambulatory Visit (INDEPENDENT_AMBULATORY_CARE_PROVIDER_SITE_OTHER): Payer: 59 | Admitting: Internal Medicine

## 2014-06-20 ENCOUNTER — Encounter: Payer: Self-pay | Admitting: Internal Medicine

## 2014-06-20 VITALS — BP 140/90 | HR 86 | Temp 98.4°F | Ht 72.0 in | Wt 224.0 lb

## 2014-06-20 DIAGNOSIS — Z23 Encounter for immunization: Secondary | ICD-10-CM

## 2014-06-20 DIAGNOSIS — R05 Cough: Secondary | ICD-10-CM

## 2014-06-20 DIAGNOSIS — R059 Cough, unspecified: Secondary | ICD-10-CM

## 2014-06-20 DIAGNOSIS — J0110 Acute frontal sinusitis, unspecified: Secondary | ICD-10-CM

## 2014-06-20 MED ORDER — AMOXICILLIN 500 MG PO CAPS: 500.0000 mg | ORAL_CAPSULE | Freq: Three times a day (TID) | ORAL | Status: DC

## 2014-06-20 NOTE — Progress Notes (Signed)
   Subjective:    Patient ID: Stephen Randolph, male    DOB: November 23, 1965, 49 y.o.   MRN: [id number]  HPI  Symptoms began 4 days ago as a nonproductive cough; subsequently as of 2/7 he noted yellow sputum as well as yellow nasal discharge. The volume from the head is greater than that from the chest. Paroxysms of cough last 2-4 seconds.  He has had pain in the right frontal sinus greater than the right maxillary sinus. No symptoms in same area on the left.He also has associated sneezing. He has noted some shortness of breath.  He quit smoking in 1991  Review of Systems He denies extrinsic symptoms of itchy, watery eyes. He has no fever, chills, or sweats.  His been no associated pleuritic type chest pain. He also denies wheezing or hemoptysis.    Objective:   Physical Exam General appearance:Adequately nourished; no acute distress or increased work of breathing is present.  No  lymphadenopathy about the head, neck, or axilla noted.   Eyes: No conjunctival inflammation or lid edema is present. There is no scleral icterus.  Ears:  External ear exam shows no significant lesions or deformities.  Otoscopic examination reveals clear canals, tympanic membranes are intact bilaterally without bulging, retraction, inflammation or discharge.  Nose:  External nasal examination shows no deformity or inflammation. Nasal mucosa are erythematous without lesions or exudates. No septal dislocation or deviation.No obstruction to airflow.   Oral exam: Dental hygiene is good; lips and gums are healthy appearing.There is no oropharyngeal erythema or exudate noted.   Neck:  No deformities, thyromegaly, masses, or tenderness noted.   Supple with full range of motion without pain.   Heart:  Normal rate and regular rhythm. S1 and S2 normal without gallop, murmur, click, rub or other extra sounds.   Lungs:Chest clear to auscultation; no wheezes, rhonchi,rales ,or rubs present.  Extremities:  No cyanosis,  edema, or clubbing  noted    Skin: Warm & dry w/o jaundice or tenting.        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 See prescription medications

## 2014-06-20 NOTE — Progress Notes (Signed)
Pre visit review using our clinic review tool, if applicable. No additional management support is needed unless otherwise documented below in the visit note. 

## 2014-06-20 NOTE — Patient Instructions (Signed)

## 2014-09-12 ENCOUNTER — Other Ambulatory Visit: Payer: Self-pay

## 2014-09-12 MED ORDER — ALLOPURINOL 300 MG PO TABS: 300.0000 mg | ORAL_TABLET | Freq: Every day | ORAL | Status: DC

## 2014-10-25 ENCOUNTER — Telehealth: Payer: Self-pay

## 2014-10-25 NOTE — Telephone Encounter (Signed)
Left pt a vmail on cell phone to come in for retest of HgA1C

## 2014-11-02 ENCOUNTER — Other Ambulatory Visit: Payer: 59

## 2014-11-07 ENCOUNTER — Other Ambulatory Visit: Payer: Self-pay

## 2015-08-14 ENCOUNTER — Ambulatory Visit (INDEPENDENT_AMBULATORY_CARE_PROVIDER_SITE_OTHER): Payer: PRIVATE HEALTH INSURANCE | Admitting: Internal Medicine

## 2015-08-14 ENCOUNTER — Encounter: Payer: Self-pay | Admitting: Internal Medicine

## 2015-08-14 VITALS — BP 150/84 | HR 61 | Temp 98.2°F | Ht 71.25 in | Wt 229.0 lb

## 2015-08-14 DIAGNOSIS — K219 Gastro-esophageal reflux disease without esophagitis: Secondary | ICD-10-CM | POA: Diagnosis not present

## 2015-08-14 DIAGNOSIS — IMO0001 Reserved for inherently not codable concepts without codable children: Secondary | ICD-10-CM

## 2015-08-14 DIAGNOSIS — M1A0790 Idiopathic chronic gout, unspecified ankle and foot, without tophus (tophi): Secondary | ICD-10-CM

## 2015-08-14 DIAGNOSIS — R030 Elevated blood-pressure reading, without diagnosis of hypertension: Secondary | ICD-10-CM

## 2015-08-14 DIAGNOSIS — E785 Hyperlipidemia, unspecified: Secondary | ICD-10-CM

## 2015-08-14 DIAGNOSIS — R7303 Prediabetes: Secondary | ICD-10-CM

## 2015-08-14 DIAGNOSIS — J302 Other seasonal allergic rhinitis: Secondary | ICD-10-CM | POA: Insufficient documentation

## 2015-08-14 DIAGNOSIS — M7750 Other enthesopathy of unspecified foot: Secondary | ICD-10-CM

## 2015-08-14 DIAGNOSIS — M109 Gout, unspecified: Secondary | ICD-10-CM

## 2015-08-14 DIAGNOSIS — M6588 Other synovitis and tenosynovitis, other site: Secondary | ICD-10-CM

## 2015-08-14 LAB — COMPREHENSIVE METABOLIC PANEL
ALT: 28 U/L (ref 0–53)
AST: 29 U/L (ref 0–37)
Albumin: 4.5 g/dL (ref 3.5–5.2)
Alkaline Phosphatase: 73 U/L (ref 39–117)
BUN: 21 mg/dL (ref 6–23)
CALCIUM: 9.7 mg/dL (ref 8.4–10.5)
CO2: 29 meq/L (ref 19–32)
CREATININE: 1.23 mg/dL (ref 0.40–1.50)
Chloride: 101 mEq/L (ref 96–112)
GFR: 66.58 mL/min (ref >60.00)
Glucose, Bld: 104 mg/dL — ABNORMAL HIGH (ref 70–99)
Potassium: 4.6 mEq/L (ref 3.5–5.1)
Sodium: 138 mEq/L (ref 135–145)
Total Bilirubin: 0.6 mg/dL (ref 0.2–1.2)
Total Protein: 7.5 g/dL (ref 6.0–8.3)

## 2015-08-14 LAB — LIPID PANEL
CHOL/HDL RATIO: 5
Cholesterol: 200 mg/dL (ref 0–200)
HDL: 41.2 mg/dL (ref >39.00)
LDL Cholesterol: 120 mg/dL — ABNORMAL HIGH (ref 0–99)
NonHDL: 158.3
Triglycerides: 193 mg/dL — ABNORMAL HIGH (ref 0.0–149.0)
VLDL: 38.6 mg/dL (ref 0.0–40.0)

## 2015-08-14 LAB — CBC
HCT: 50.4 % (ref 39.0–52.0)
Hemoglobin: 17.5 g/dL — ABNORMAL HIGH (ref 13.0–17.0)
MCHC: 34.6 g/dL (ref 30.0–36.0)
MCV: 88.7 fl (ref 78.0–100.0)
PLATELETS: 235 10*3/uL (ref 150.0–400.0)
RBC: 5.69 Mil/uL (ref 4.22–5.81)
RDW: 13.3 % (ref 11.5–15.5)
WBC: 8.4 10*3/uL (ref 4.0–10.5)

## 2015-08-14 LAB — HEMOGLOBIN A1C: Hgb A1c MFr Bld: 5.6 % (ref 4.6–6.5)

## 2015-08-14 LAB — URIC ACID: Uric Acid, Serum: 6.8 mg/dL (ref 4.0–7.8)

## 2015-08-14 MED ORDER — MELOXICAM 15 MG PO TABS: 15.0000 mg | ORAL_TABLET | Freq: Every day | ORAL | Status: DC | PRN

## 2015-08-14 MED ORDER — ALLOPURINOL 300 MG PO TABS: 300.0000 mg | ORAL_TABLET | Freq: Every day | ORAL | Status: DC

## 2015-08-14 NOTE — Assessment & Plan Note (Signed)
Diet controlled Discussed avoiding triggers Discussed how weight loss would help improve his reflux

## 2015-08-14 NOTE — Progress Notes (Signed)
HPI  Pt presents to the clinic today to establish care and for management of the conditions listed below. Stephen Randolph is transferring car from Dr. Linna Darner.  HLD: Stephen Randolph has not had his cholesterol levels checked in 3 years. Stephen Randolph takes Fish Oil 2 x day. Stephen Randolph does try to consume a low fat diet.  DM 2: Stephen Randolph reports Stephen Randolph does not have diabetes but Dr. Linna Darner told him Stephen Randolph was prediabetic. Stephen Randolph was advised to cut out all high fructose corn syrup which Stephen Randolph has done. Stephen Randolph has not had an A1C checked in 3 years.  GERD: Currently not an issue since Stephen Randolph changed his diet to avoid fried foods. Stephen Randolph also thinks that chewing tobacco was a trigger.  Tendonitis, foot: Stephen Randolph takes Meloxicam as needed.  Gout: Stephen Randolph takes Allopurinol daily. Stephen Randolph has not had a flare in over a year. It usually occurs in his great toes.  Seasonal Allergies: Worse in the Spring. Stephen Randolph is taking Flonase or Chemical engineer daily.  Past Medical History  Diagnosis Date  . Hyperlipidemia   . Diabetes mellitus     borderline/ per pt not diabetic!  Marland Kitchen GERD (gastroesophageal reflux disease)   . Adenomatous polyp 2008  . Diverticulosis     Current Outpatient Prescriptions  Medication Sig Dispense Refill  . allopurinol (ZYLOPRIM) 300 MG tablet Take 1 tablet (300 mg total) by mouth daily. 90 tablet 1  . fexofenadine (ALLEGRA) 180 MG tablet Take 180 mg by mouth daily.    . Fluticasone Propionate (FLONASE ALLERGY RELIEF NA) Place into the nose as needed.     . meloxicam (MOBIC) 15 MG tablet Take 1 tablet by mouth daily as needed.  0  . Multiple Vitamin (MULTIVITAMIN) tablet Take 1 tablet by mouth daily.      . Omega-3 Fatty Acids (FISH OIL) 1000 MG CAPS Take 1,000 mg by mouth 2 (two) times daily.     Marland Kitchen triamcinolone (NASACORT) 55 MCG/ACT AERO nasal inhaler Place 2 sprays into the nose as needed.     No current facility-administered medications for this visit.    No Known Allergies  Family History  Problem Relation Age of Onset  . Colon cancer Father   .  Hypertension Father   . Brain cancer Father   . Lung cancer Mother     former smoker  . Hypertension Mother   . Hyperlipidemia Mother   . Colon cancer Paternal Grandfather   . Heart attack Maternal Grandfather     pre 55  . Stroke Maternal Grandfather     not definitely  . Diabetes Neg Hx     Social History   Social History  . Marital Status: Married    Spouse Name: N/A  . Number of Children: N/A  . Years of Education: N/A   Occupational History  . Not on file.   Social History Main Topics  . Smoking status: Former Smoker    Types: Cigarettes    Quit date: 05/13/1990  . Smokeless tobacco: Former Systems developer     Comment: smoked [phone], up to 1 ppd; chewing tobacco 1992-2002;intermittent 2003-2012 none since 2012  . Alcohol Use: 3.6 - 4.2 oz/week    6-7 Cans of beer per week     Comment:  occasionally  . Drug Use: No  . Sexual Activity: Not on file   Other Topics Concern  . Not on file   Social History Narrative    ROS:  Constitutional: Denies fever, malaise, fatigue, headache or abrupt weight changes.  HEENT:  Pt reports watery eyes, runny nose. Denies eye pain, eye redness, ear pain, ringing in the ears, wax buildup, nasal congestion, bloody nose, or sore throat. Respiratory: Denies difficulty breathing, shortness of breath, cough or sputum production.   Cardiovascular: Denies chest pain, chest tightness, palpitations or swelling in the hands or feet.  Gastrointestinal: Denies abdominal pain, bloating, constipation, diarrhea or blood in the stool.  Musculoskeletal: Denies decrease in range of motion, difficulty with gait, muscle pain or joint pain and swelling.  Skin: Denies redness, rashes, lesions or ulcercations.  Neurological: Denies dizziness, difficulty with memory, difficulty with speech or problems with balance and coordination.  Psych: Denies anxiety, depression, SI/HI.  No other specific complaints in a complete review of systems (except as listed in HPI  above).  PE:  BP 150/84 mmHg  Pulse 61  Temp(Src) 98.2 F (36.8 C) (Oral)  Ht 5' 11.25" (1.81 m)  Wt 229 lb (103.874 kg)  BMI 31.71 kg/m2  SpO2 98% Wt Readings from Last 3 Encounters:  08/14/15 229 lb (103.874 kg)  06/20/14 224 lb (101.606 kg)  09/16/13 246 lb (111.585 kg)    General: Appears his stated age, obese in NAD. HEENT: Head: normal shape and size, no sinus tenderness noted; Eyes: sclera white, no icterus, conjunctiva pink; Throat/Mouth: Teeth present, mucosa pink and moist, no lesions or ulcerations noted.  Neck: Neck supple, trachea midline. No masses, lumps or thyromegaly present.  Cardiovascular: Normal rate and rhythm. S1,S2 noted.  No murmur, rubs or gallops noted.  Pulmonary/Chest: Normal effort and positive vesicular breath sounds. No respiratory distress. No wheezes, rales or ronchi noted.  Abdomen: Soft and nontender. Normal bowel sounds. No distention or masses noted.  Neurological: Alert and oriented.  Psychiatric: Mood and affect normal. Behavior is normal. Judgment and thought content normal.     BMET    Component Value Date/Time   NA 137 08/03/2013 0843   K 4.0 08/03/2013 0843   CL 100 08/03/2013 0843   CO2 29 08/03/2013 0843   GLUCOSE 109* 08/03/2013 0843   BUN 15 08/03/2013 0843   CREATININE 1.3 08/03/2013 0843   CALCIUM 9.4 08/03/2013 0843   GFRNONAA 70.63 10/26/2008 0000    Lipid Panel     Component Value Date/Time   CHOL 190 08/03/2013 0843   TRIG 206.0* 08/03/2013 0843   HDL 34.50* 08/03/2013 0843   CHOLHDL 6 08/03/2013 0843   VLDL 41.2* 08/03/2013 0843   LDLCALC 114* 08/03/2013 0843    CBC    Component Value Date/Time   WBC 6.0 08/03/2013 0843   RBC 5.67 08/03/2013 0843   HGB 17.3* 08/03/2013 0843   HCT 51.1 08/03/2013 0843   PLT 317.0 08/03/2013 0843   MCV 90.1 08/03/2013 0843   MCHC 33.9 08/03/2013 0843   RDW 12.8 08/03/2013 0843   LYMPHSABS 2.0 08/03/2013 0843   MONOABS 0.6 08/03/2013 0843   EOSABS 0.1 08/03/2013  0843   BASOSABS 0.0 08/03/2013 0843    Hgb A1C Lab Results  Component Value Date   HGBA1C 5.7 08/03/2013     Assessment and Plan:  Elevated blood pressure:  No previous elevated readings Will continue to monitor Discussed DASH diet and exercise to lose weight  Prediabetes:  Will check A1C today  Tendonitis, foot:  Continue Meloxicam prn  RTC in 6 months for annual exam

## 2015-08-14 NOTE — Progress Notes (Signed)
Pre visit review using our clinic review tool, if applicable. No additional management support is needed unless otherwise documented below in the visit note. 

## 2015-08-14 NOTE — Assessment & Plan Note (Signed)
Worse right now Continue Allegra and Nasocort

## 2015-08-14 NOTE — Patient Instructions (Signed)

## 2015-08-14 NOTE — Assessment & Plan Note (Signed)
Will check CMET and Lipid Profile today Encouraged him to consume a low fat diet Continue Fish Oil

## 2015-08-14 NOTE — Assessment & Plan Note (Signed)
Controlled with Allopurinol Will check uric acid level today

## 2016-02-26 ENCOUNTER — Ambulatory Visit (INDEPENDENT_AMBULATORY_CARE_PROVIDER_SITE_OTHER): Payer: PRIVATE HEALTH INSURANCE | Admitting: Internal Medicine

## 2016-02-26 ENCOUNTER — Encounter: Payer: Self-pay | Admitting: Internal Medicine

## 2016-02-26 ENCOUNTER — Other Ambulatory Visit (INDEPENDENT_AMBULATORY_CARE_PROVIDER_SITE_OTHER): Payer: PRIVATE HEALTH INSURANCE

## 2016-02-26 VITALS — BP 146/90 | HR 69 | Temp 98.0°F | Ht 71.25 in | Wt 230.0 lb

## 2016-02-26 DIAGNOSIS — I10 Essential (primary) hypertension: Secondary | ICD-10-CM

## 2016-02-26 DIAGNOSIS — H6123 Impacted cerumen, bilateral: Secondary | ICD-10-CM

## 2016-02-26 DIAGNOSIS — Z23 Encounter for immunization: Secondary | ICD-10-CM

## 2016-02-26 DIAGNOSIS — M1A0790 Idiopathic chronic gout, unspecified ankle and foot, without tophus (tophi): Secondary | ICD-10-CM

## 2016-02-26 DIAGNOSIS — Z0000 Encounter for general adult medical examination without abnormal findings: Secondary | ICD-10-CM | POA: Diagnosis not present

## 2016-02-26 DIAGNOSIS — R7309 Other abnormal glucose: Secondary | ICD-10-CM | POA: Diagnosis not present

## 2016-02-26 LAB — CBC
HEMATOCRIT: 51.2 % (ref 39.0–52.0)
HEMOGLOBIN: 17.8 g/dL — AB (ref 13.0–17.0)
MCHC: 34.8 g/dL (ref 30.0–36.0)
MCV: 90 fl (ref 78.0–100.0)
Platelets: 238 10*3/uL (ref 150.0–400.0)
RBC: 5.68 Mil/uL (ref 4.22–5.81)
RDW: 13.1 % (ref 11.5–15.5)
WBC: 6.1 10*3/uL (ref 4.0–10.5)

## 2016-02-26 LAB — COMPREHENSIVE METABOLIC PANEL
ALT: 27 U/L (ref 0–53)
AST: 28 U/L (ref 0–37)
Albumin: 4.6 g/dL (ref 3.5–5.2)
Alkaline Phosphatase: 59 U/L (ref 39–117)
BUN: 19 mg/dL (ref 6–23)
CHLORIDE: 101 meq/L (ref 96–112)
CO2: 27 mEq/L (ref 19–32)
Calcium: 9.3 mg/dL (ref 8.4–10.5)
Creatinine, Ser: 1.41 mg/dL (ref 0.40–1.50)
GFR: 56.74 mL/min — AB (ref >60.00)
Glucose, Bld: 114 mg/dL — ABNORMAL HIGH (ref 70–99)
POTASSIUM: 4.5 meq/L (ref 3.5–5.1)
SODIUM: 138 meq/L (ref 135–145)
Total Bilirubin: 0.9 mg/dL (ref 0.2–1.2)
Total Protein: 7.4 g/dL (ref 6.0–8.3)

## 2016-02-26 LAB — LIPID PANEL
CHOL/HDL RATIO: 4
CHOLESTEROL: 196 mg/dL (ref 0–200)
HDL: 47.3 mg/dL (ref >39.00)
NonHDL: 148.92
Triglycerides: 227 mg/dL — ABNORMAL HIGH (ref 0.0–149.0)
VLDL: 45.4 mg/dL — AB (ref 0.0–40.0)

## 2016-02-26 LAB — URIC ACID: Uric Acid, Serum: 7.4 mg/dL (ref 4.0–7.8)

## 2016-02-26 LAB — LDL CHOLESTEROL, DIRECT: LDL DIRECT: 131 mg/dL

## 2016-02-26 LAB — HEMOGLOBIN A1C: HEMOGLOBIN A1C: 5.3 % (ref 4.6–6.5)

## 2016-02-26 NOTE — Patient Instructions (Signed)

## 2016-02-26 NOTE — Progress Notes (Signed)
Subjective:    Patient ID: Stephen Randolph, male    DOB: 05/11/1966, 50 y.o.   MRN: [id number]  HPI  Pt presents to the clinic today for his annual exam.  Flu: 04/2015 Tetanus: 2010 Colon Screening: 09/2013, 5 years Vision Screening: as needed Dentist: biannually  Diet: He does eat lean meat. He consumes fruits and veggies daily. He tries to avoid fried foods. He drinks mostly water. Exercise: None recently, he reports he needs to get back into the gym.  Review of Systems      Past Medical History:  Diagnosis Date  . Adenomatous polyp 2008  . Diabetes mellitus    borderline/ per pt not diabetic!  . Diverticulosis   . GERD (gastroesophageal reflux disease)   . Hyperlipidemia     Current Outpatient Prescriptions  Medication Sig Dispense Refill  . allopurinol (ZYLOPRIM) 300 MG tablet Take 1 tablet (300 mg total) by mouth daily. 60 tablet 2  . fexofenadine (ALLEGRA) 180 MG tablet Take 180 mg by mouth daily.    . Fluticasone Propionate (FLONASE ALLERGY RELIEF NA) Place into the nose as needed.     . meloxicam (MOBIC) 15 MG tablet Take 1 tablet (15 mg total) by mouth daily as needed. 60 tablet 2  . Multiple Vitamin (MULTIVITAMIN) tablet Take 1 tablet by mouth daily.      . Omega-3 Fatty Acids (FISH OIL) 1000 MG CAPS Take 1,000 mg by mouth 2 (two) times daily.     Marland Kitchen triamcinolone (NASACORT) 55 MCG/ACT AERO nasal inhaler Place 2 sprays into the nose as needed.     No current facility-administered medications for this visit.     No Known Allergies  Family History  Problem Relation Age of Onset  . Colon cancer Father   . Hypertension Father   . Brain cancer Father   . Lung cancer Mother     former smoker  . Hypertension Mother   . Hyperlipidemia Mother   . Colon cancer Paternal Grandfather   . Heart attack Maternal Grandfather     pre 55  . Stroke Maternal Grandfather     not definitely  . Diabetes Neg Hx     Social History   Social History  . Marital  status: Married    Spouse name: N/A  . Number of children: N/A  . Years of education: N/A   Occupational History  . Not on file.   Social History Main Topics  . Smoking status: Former Smoker    Types: Cigarettes    Quit date: 05/13/1990  . Smokeless tobacco: Former Systems developer     Comment: smoked [phone], up to 1 ppd; chewing tobacco 1992-2002;intermittent 2003-2012 none since 2012  . Alcohol use 3.6 - 4.2 oz/week    6 - 7 Cans of beer per week     Comment:  occasionally  . Drug use: No  . Sexual activity: Yes   Other Topics Concern  . Not on file   Social History Narrative  . No narrative on file     Constitutional: Denies fever, malaise, fatigue, headache or abrupt weight changes.  HEENT: Denies eye pain, eye redness, ear pain, ringing in the ears, wax buildup, runny nose, nasal congestion, bloody nose, or sore throat. Respiratory: Denies difficulty breathing, shortness of breath, cough or sputum production.   Cardiovascular: Denies chest pain, chest tightness, palpitations or swelling in the hands or feet.  Gastrointestinal: Pt reports occasional reflux. Denies abdominal pain, bloating, constipation, diarrhea or blood in the  stool.  GU: Denies urgency, frequency, pain with urination, burning sensation, blood in urine, odor or discharge. Musculoskeletal: Denies decrease in range of motion, difficulty with gait, muscle pain or joint pain and swelling.  Skin: Denies redness, rashes, lesions or ulcercations.  Neurological: Denies dizziness, difficulty with memory, difficulty with speech or problems with balance and coordination.  Psych: Denies anxiety, depression, SI/HI.  No other specific complaints in a complete review of systems (except as listed in HPI above).  Objective:   Physical Exam   BP (!) 146/90   Pulse 69   Temp 98 F (36.7 C) (Oral)   Ht 5' 11.25" (1.81 m)   SpO2 97%  Wt Readings from Last 3 Encounters:  08/14/15 229 lb (103.9 kg)  06/20/14 224 lb (101.6 kg)   09/16/13 246 lb (111.6 kg)    General: Appears his stated age, obese in NAD. Skin: Warm, dry and intact.  HEENT: Head: normal shape and size; Eyes: sclera white, no icterus, conjunctiva pink, PERRLA and EOMs intact; Ears: bilateral cerumen impaction; Throat/Mouth: Teeth present, mucosa pink and moist, no exudate, lesions or ulcerations noted.  Neck:  Neck supple, trachea midline. No masses, lumps or thyromegaly present.  Cardiovascular: Normal rate and rhythm. S1,S2 noted.  No murmur, rubs or gallops noted. No JVD or BLE edema. No carotid bruits noted. Pulmonary/Chest: Normal effort and positive vesicular breath sounds. No respiratory distress. No wheezes, rales or ronchi noted.  Abdomen: Soft and nontender. Normal bowel sounds. No distention or masses noted. Liver, spleen and kidneys non palpable. Musculoskeletal: Normal range of motion. Strength 5/5 BUE/BLE. No difficulty with gait.  Neurological: Alert and oriented. Cranial nerves II-XII grossly intact. Coordination normal.  Psychiatric: Mood and affect normal. Behavior is normal. Judgment and thought content normal.    BMET    Component Value Date/Time   NA 138 08/14/2015 1128   K 4.6 08/14/2015 1128   CL 101 08/14/2015 1128   CO2 29 08/14/2015 1128   GLUCOSE 104 (H) 08/14/2015 1128   BUN 21 08/14/2015 1128   CREATININE 1.23 08/14/2015 1128   CALCIUM 9.7 08/14/2015 1128   GFRNONAA 70.63 10/26/2008 0000    Lipid Panel     Component Value Date/Time   CHOL 200 08/14/2015 1128   TRIG 193.0 (H) 08/14/2015 1128   HDL 41.20 08/14/2015 1128   CHOLHDL 5 08/14/2015 1128   VLDL 38.6 08/14/2015 1128   LDLCALC 120 (H) 08/14/2015 1128    CBC    Component Value Date/Time   WBC 8.4 08/14/2015 1128   RBC 5.69 08/14/2015 1128   HGB 17.5 (H) 08/14/2015 1128   HCT 50.4 08/14/2015 1128   PLT 235.0 08/14/2015 1128   MCV 88.7 08/14/2015 1128   MCHC 34.6 08/14/2015 1128   RDW 13.3 08/14/2015 1128   LYMPHSABS 2.0 08/03/2013 0843    MONOABS 0.6 08/03/2013 0843   EOSABS 0.1 08/03/2013 0843   BASOSABS 0.0 08/03/2013 0843    Hgb A1C Lab Results  Component Value Date   HGBA1C 5.6 08/14/2015        Assessment & Plan:   Preventative Health Maintenance:  Flu shot today Tetanus UTD Colon Screening due 2020 Discussed prostate cancer screening starting 2018 Encouraged him to consume a balanced diet and exercise regimen Encouraged him to see a dentist and eye doctor annually Will check CBC, CMET, Lipid, uric acid today He declines HIV screening  Bilateral Cerumen Impaction:  He declines manual lavage today Advised him to try Debrox OTC  HTN:  New onset Elevated readings on 3 different occasions Discussed treating with BP medications- he wants to work harder on diet and exercise for now Discussed symptoms of HTN- he will let me know if he notices any of these Advised him to monitor BP at pharmacy any time he goes BP goal < 130/80  RTC in 1 year, sooner if needed

## 2016-05-01 ENCOUNTER — Other Ambulatory Visit: Payer: Self-pay | Admitting: Internal Medicine

## 2016-05-01 DIAGNOSIS — M1A0790 Idiopathic chronic gout, unspecified ankle and foot, without tophus (tophi): Secondary | ICD-10-CM

## 2016-05-01 NOTE — Telephone Encounter (Signed)
Ok to continue, sent electronically 

## 2016-05-01 NOTE — Telephone Encounter (Signed)
Last filled 08/14/2015--last OV 02/2016 CPE--please advise if okay for pt to continue

## 2016-07-08 ENCOUNTER — Encounter: Payer: Self-pay | Admitting: Internal Medicine

## 2016-07-08 ENCOUNTER — Ambulatory Visit (INDEPENDENT_AMBULATORY_CARE_PROVIDER_SITE_OTHER): Payer: PRIVATE HEALTH INSURANCE | Admitting: Internal Medicine

## 2016-07-08 VITALS — BP 148/90 | HR 73 | Temp 98.0°F | Wt 237.0 lb

## 2016-07-08 DIAGNOSIS — K219 Gastro-esophageal reflux disease without esophagitis: Secondary | ICD-10-CM

## 2016-07-08 DIAGNOSIS — I10 Essential (primary) hypertension: Secondary | ICD-10-CM | POA: Diagnosis not present

## 2016-07-08 MED ORDER — LISINOPRIL-HYDROCHLOROTHIAZIDE 10-12.5 MG PO TABS: 1.0000 | ORAL_TABLET | Freq: Every day | ORAL | 0 refills | Status: DC

## 2016-07-08 NOTE — Progress Notes (Signed)
Subjective:    Patient ID: Stephen Randolph, male    DOB: 15-Dec-1965, 51 y.o.   MRN: [id number]  HPI  Pt presents to the clinic today to follow up HTN. His BP at his last visit was 140/90. He refused to start antihypertensive medication at that time, but instead wanted to try to work on lifestyle changes. He reports his BP at home has been as high as 180/100. He has been having headaches, intermittent palpitations and mild shortness of breath. He denies dizziness, visual changes or chest pain. He thinks he would like to go ahead and start on some antihypertensive medication at this time.  He also reports intermittent nausea and reflux. This happens a few times per week. He takes Pepcid AC with good relief. He denies vomiting, constipation or diarrhea.  Review of Systems  Past Medical History:  Diagnosis Date  . Adenomatous polyp 2008  . Diabetes mellitus    borderline/ per pt not diabetic!  . Diverticulosis   . GERD (gastroesophageal reflux disease)   . Hyperlipidemia     Current Outpatient Prescriptions  Medication Sig Dispense Refill  . allopurinol (ZYLOPRIM) 300 MG tablet take 1 tablet by mouth once daily 60 tablet 2  . fexofenadine (ALLEGRA) 180 MG tablet Take 180 mg by mouth daily.    . Fluticasone Propionate (FLONASE ALLERGY RELIEF NA) Place into the nose as needed.     . meloxicam (MOBIC) 15 MG tablet take 1 tablet by mouth once daily if needed 60 tablet 2  . Multiple Vitamin (MULTIVITAMIN) tablet Take 1 tablet by mouth daily.      . Omega-3 Fatty Acids (FISH OIL) 1000 MG CAPS Take 1,000 mg by mouth 2 (two) times daily.     Marland Kitchen triamcinolone (NASACORT) 55 MCG/ACT AERO nasal inhaler Place 2 sprays into the nose as needed.     No current facility-administered medications for this visit.     No Known Allergies  Family History  Problem Relation Age of Onset  . Colon cancer Father   . Hypertension Father   . Brain cancer Father   . Lung cancer Mother     former  smoker  . Hypertension Mother   . Hyperlipidemia Mother   . Colon cancer Paternal Grandfather   . Heart attack Maternal Grandfather     pre 55  . Stroke Maternal Grandfather     not definitely  . Diabetes Neg Hx     Social History   Social History  . Marital status: Married    Spouse name: N/A  . Number of children: N/A  . Years of education: N/A   Occupational History  . Not on file.   Social History Main Topics  . Smoking status: Former Smoker    Types: Cigarettes    Quit date: 05/13/1990  . Smokeless tobacco: Former Systems developer     Comment: smoked [phone], up to 1 ppd; chewing tobacco 1992-2002;intermittent 2003-2012 none since 2012  . Alcohol use 3.6 - 4.2 oz/week    6 - 7 Cans of beer per week     Comment:  occasionally  . Drug use: No  . Sexual activity: Yes   Other Topics Concern  . Not on file   Social History Narrative  . No narrative on file     Constitutional: Pt reports headaches. Denies fever, malaise, fatigue, or abrupt weight changes.  HEENT: Denies eye pain, eye redness, ear pain, ringing in the ears, wax buildup, runny nose, nasal congestion, bloody nose,  or sore throat. Respiratory: Pt reports shortness of breath. Denies difficulty breathing, cough or sputum production.   Cardiovascular: Pt reports palpitations. Denies chest pain, chest tightness, or swelling in the hands or feet.  Gastrointestinal: Pt reports nausea and reflux. Denies abdominal pain, bloating, constipation, diarrhea or blood in the stool.   No other specific complaints in a complete review of systems (except as listed in HPI above).     Objective:   Physical Exam   BP (!) 148/90   Pulse 73   Temp 98 F (36.7 C) (Oral)   Wt 237 lb (107.5 kg)   SpO2 98%   BMI 32.82 kg/m  Wt Readings from Last 3 Encounters:  07/08/16 237 lb (107.5 kg)  02/26/16 230 lb (104.3 kg)  08/14/15 229 lb (103.9 kg)    General: Appears his stated age, obese in NAD. Cardiovascular: Normal rate and  rhythm. S1,S2 noted. Occasional extra beat noted. No murmur, rubs or gallops noted. Pulmonary/Chest: Normal effort and positive vesicular breath sounds. No respiratory distress. No wheezes, rales or ronchi noted.  Abdomen: Soft and nontender. Normal bowel sounds. No distention or masses noted.     BMET    Component Value Date/Time   NA 138 02/26/2016 0854   K 4.5 02/26/2016 0854   CL 101 02/26/2016 0854   CO2 27 02/26/2016 0854   GLUCOSE 114 (H) 02/26/2016 0854   BUN 19 02/26/2016 0854   CREATININE 1.41 02/26/2016 0854   CALCIUM 9.3 02/26/2016 0854   GFRNONAA 70.63 10/26/2008 0000    Lipid Panel     Component Value Date/Time   CHOL 196 02/26/2016 0854   TRIG 227.0 (H) 02/26/2016 0854   HDL 47.30 02/26/2016 0854   CHOLHDL 4 02/26/2016 0854   VLDL 45.4 (H) 02/26/2016 0854   LDLCALC 120 (H) 08/14/2015 1128    CBC    Component Value Date/Time   WBC 6.1 02/26/2016 0854   RBC 5.68 02/26/2016 0854   HGB 17.8 (H) 02/26/2016 0854   HCT 51.2 02/26/2016 0854   PLT 238.0 02/26/2016 0854   MCV 90.0 02/26/2016 0854   MCHC 34.8 02/26/2016 0854   RDW 13.1 02/26/2016 0854   LYMPHSABS 2.0 08/03/2013 0843   MONOABS 0.6 08/03/2013 0843   EOSABS 0.1 08/03/2013 0843   BASOSABS 0.0 08/03/2013 0843    Hgb A1C Lab Results  Component Value Date   HGBA1C 5.3 02/26/2016           Assessment & Plan:   HTN:  Will start Lisinopril HCT Encouraged exercise and low salt diet  GERD:  Try Prilosec OTC Try to avoid foods that trigger your reflux  RTC in 3 weeks for follow up of HTN. Will get ECG and labs at that time Webb Silversmith, NP

## 2016-07-08 NOTE — Patient Instructions (Signed)
Hypertension Hypertension is another name for high blood pressure. High blood pressure forces your heart to work harder to pump blood. A blood pressure reading has two numbers, which includes a higher number over a lower number (example: 110/72). Follow these instructions at home:  Have your blood pressure rechecked by your doctor.  Only take medicine as told by your doctor. Follow the directions carefully. The medicine does not work as well if you skip doses. Skipping doses also puts you at risk for problems.  Do not smoke.  Monitor your blood pressure at home as told by your doctor. Contact a doctor if:  You think you are having a reaction to the medicine you are taking.  You have repeat headaches or feel dizzy.  You have puffiness (swelling) in your ankles.  You have trouble with your vision. Get help right away if:  You get a very bad headache and are confused.  You feel weak, numb, or faint.  You get chest or belly (abdominal) pain.  You throw up (vomit).  You cannot breathe very well. This information is not intended to replace advice given to you by your health care provider. Make sure you discuss any questions you have with your health care provider. Document Released: 10/16/2007 Document Revised: 10/05/2015 Document Reviewed: 02/19/2013 Elsevier Interactive Patient Education  2017 Elsevier Inc.  

## 2016-07-16 ENCOUNTER — Encounter: Payer: Self-pay | Admitting: Internal Medicine

## 2016-07-16 MED ORDER — TEMAZEPAM 7.5 MG PO CAPS: 7.5000 mg | ORAL_CAPSULE | Freq: Every evening | ORAL | 0 refills | Status: DC | PRN

## 2016-07-16 NOTE — Telephone Encounter (Signed)
Left refill on voice mail at pharmacy  

## 2016-07-16 NOTE — Addendum Note (Signed)
Addended by: Pilar Grammes on: 07/16/2016 04:18 PM   Modules accepted: Orders

## 2016-07-29 ENCOUNTER — Encounter: Payer: Self-pay | Admitting: Internal Medicine

## 2016-07-29 ENCOUNTER — Ambulatory Visit (INDEPENDENT_AMBULATORY_CARE_PROVIDER_SITE_OTHER): Payer: PRIVATE HEALTH INSURANCE | Admitting: Internal Medicine

## 2016-07-29 VITALS — BP 124/78 | HR 62 | Temp 97.6°F | Wt 240.8 lb

## 2016-07-29 DIAGNOSIS — I10 Essential (primary) hypertension: Secondary | ICD-10-CM

## 2016-07-29 LAB — BASIC METABOLIC PANEL
BUN: 17 mg/dL (ref 6–23)
CO2: 32 mEq/L (ref 19–32)
CREATININE: 1.26 mg/dL (ref 0.40–1.50)
Calcium: 10.1 mg/dL (ref 8.4–10.5)
Chloride: 97 mEq/L (ref 96–112)
GFR: 64.5 mL/min (ref >60.00)
GLUCOSE: 97 mg/dL (ref 70–99)
Potassium: 4.7 mEq/L (ref 3.5–5.1)
Sodium: 137 mEq/L (ref 135–145)

## 2016-07-29 MED ORDER — LISINOPRIL-HYDROCHLOROTHIAZIDE 10-12.5 MG PO TABS: 1.0000 | ORAL_TABLET | Freq: Every day | ORAL | 5 refills | Status: DC

## 2016-07-29 NOTE — Assessment & Plan Note (Signed)
Now at goal Lisinopril HCT refilled today BMET today ECG today- normal, no acute findings

## 2016-07-29 NOTE — Progress Notes (Signed)
Subjective:    Patient ID: Stephen Randolph, male    DOB: 07/11/1965, 51 y.o.   MRN: [id number]  HPI  Pt presents to the clinic today for 3 week follow up of HTN. His BP at his last visit was 148/90. He was started on Lisinopril HCTZ. He has been taking the medication as prescribed. He did feel SOB while laying down the first week, but reports that has improved. His BP today is 124/78. He reports improvement in his headaches, palpitations and shortness of breath.   Review of Systems  Past Medical History:  Diagnosis Date  . Adenomatous polyp 2008  . Diabetes mellitus    borderline/ per pt not diabetic!  . Diverticulosis   . GERD (gastroesophageal reflux disease)   . Hyperlipidemia     Current Outpatient Prescriptions  Medication Sig Dispense Refill  . allopurinol (ZYLOPRIM) 300 MG tablet take 1 tablet by mouth once daily 60 tablet 2  . fexofenadine (ALLEGRA) 180 MG tablet Take 180 mg by mouth daily.    . Fluticasone Propionate (FLONASE ALLERGY RELIEF NA) Place into the nose as needed.     Marland Kitchen lisinopril-hydrochlorothiazide (PRINZIDE,ZESTORETIC) 10-12.5 MG tablet Take 1 tablet by mouth daily. 30 tablet 0  . meloxicam (MOBIC) 15 MG tablet take 1 tablet by mouth once daily if needed 60 tablet 2  . Multiple Vitamin (MULTIVITAMIN) tablet Take 1 tablet by mouth daily.      . Omega-3 Fatty Acids (FISH OIL) 1000 MG CAPS Take 1,000 mg by mouth 2 (two) times daily.     . temazepam (RESTORIL) 7.5 MG capsule Take 1 capsule (7.5 mg total) by mouth at bedtime as needed for sleep. 30 capsule 0  . triamcinolone (NASACORT) 55 MCG/ACT AERO nasal inhaler Place 2 sprays into the nose as needed.     No current facility-administered medications for this visit.     No Known Allergies  Family History  Problem Relation Age of Onset  . Colon cancer Father   . Hypertension Father   . Brain cancer Father   . Lung cancer Mother     former smoker  . Hypertension Mother   . Hyperlipidemia Mother    . Colon cancer Paternal Grandfather   . Heart attack Maternal Grandfather     pre 55  . Stroke Maternal Grandfather     not definitely  . Diabetes Neg Hx     Social History   Social History  . Marital status: Married    Spouse name: N/A  . Number of children: N/A  . Years of education: N/A   Occupational History  . Not on file.   Social History Main Topics  . Smoking status: Former Smoker    Types: Cigarettes    Quit date: 05/13/1990  . Smokeless tobacco: Former Systems developer     Comment: smoked [phone], up to 1 ppd; chewing tobacco 1992-2002;intermittent 2003-2012 none since 2012  . Alcohol use 3.6 - 4.2 oz/week    6 - 7 Cans of beer per week     Comment:  occasionally  . Drug use: No  . Sexual activity: Yes   Other Topics Concern  . Not on file   Social History Narrative  . No narrative on file     Constitutional: Denies fever, malaise, fatigue, headache or abrupt weight changes.  Respiratory: Denies difficulty breathing, shortness of breath, cough or sputum production.   Cardiovascular: Denies chest pain, chest tightness, palpitations or swelling in the hands or feet.  Neurological:  Denies dizziness, difficulty with memory, difficulty with speech or problems with balance and coordination.   No other specific complaints in a complete review of systems (except as listed in HPI above).     Objective:   Physical Exam   BP 124/78   Pulse 62   Temp 97.6 F (36.4 C) (Oral)   Wt 240 lb 12 oz (109.2 kg)   SpO2 99%   BMI 33.34 kg/m  Wt Readings from Last 3 Encounters:  07/29/16 240 lb 12 oz (109.2 kg)  07/08/16 237 lb (107.5 kg)  02/26/16 230 lb (104.3 kg)    General: Appears her stated age, obese in NAD. Cardiovascular: Normal rate and rhythm. S1,S2 noted.  No murmur, rubs or gallops noted.  Pulmonary/Chest: Normal effort and positive vesicular breath sounds. No respiratory distress. No wheezes, rales or ronchi noted.  Neurological: Alert and oriented.    BMET    Component Value Date/Time   NA 138 02/26/2016 0854   K 4.5 02/26/2016 0854   CL 101 02/26/2016 0854   CO2 27 02/26/2016 0854   GLUCOSE 114 (H) 02/26/2016 0854   BUN 19 02/26/2016 0854   CREATININE 1.41 02/26/2016 0854   CALCIUM 9.3 02/26/2016 0854   GFRNONAA 70.63 10/26/2008 0000    Lipid Panel     Component Value Date/Time   CHOL 196 02/26/2016 0854   TRIG 227.0 (H) 02/26/2016 0854   HDL 47.30 02/26/2016 0854   CHOLHDL 4 02/26/2016 0854   VLDL 45.4 (H) 02/26/2016 0854   LDLCALC 120 (H) 08/14/2015 1128    CBC    Component Value Date/Time   WBC 6.1 02/26/2016 0854   RBC 5.68 02/26/2016 0854   HGB 17.8 (H) 02/26/2016 0854   HCT 51.2 02/26/2016 0854   PLT 238.0 02/26/2016 0854   MCV 90.0 02/26/2016 0854   MCHC 34.8 02/26/2016 0854   RDW 13.1 02/26/2016 0854   LYMPHSABS 2.0 08/03/2013 0843   MONOABS 0.6 08/03/2013 0843   EOSABS 0.1 08/03/2013 0843   BASOSABS 0.0 08/03/2013 0843    Hgb A1C Lab Results  Component Value Date   HGBA1C 5.3 02/26/2016           Assessment & Plan:

## 2016-07-29 NOTE — Patient Instructions (Signed)
Hypertension °Hypertension is another name for high blood pressure. High blood pressure forces your heart to work harder to pump blood. This can cause problems over time. °There are two numbers in a blood pressure reading. There is a top number (systolic) over a bottom number (diastolic). It is best to have a blood pressure below 120/80. Healthy choices can help lower your blood pressure. You may need medicine to help lower your blood pressure if: °· Your blood pressure cannot be lowered with healthy choices. °· Your blood pressure is higher than 130/80. °Follow these instructions at home: °Eating and drinking  °· If directed, follow the DASH eating plan. This diet includes: °¨ Filling half of your plate at each meal with fruits and vegetables. °¨ Filling one quarter of your plate at each meal with whole grains. Whole grains include whole wheat pasta, brown rice, and whole grain bread. °¨ Eating or drinking low-fat dairy products, such as skim milk or low-fat yogurt. °¨ Filling one quarter of your plate at each meal with low-fat (lean) proteins. Low-fat proteins include fish, skinless chicken, eggs, beans, and tofu. °¨ Avoiding fatty meat, cured and processed meat, or chicken with skin. °¨ Avoiding premade or processed food. °· Eat less than 1,500 mg of salt (sodium) a day. °· Limit alcohol use to no more than 1 drink a day for nonpregnant women and 2 drinks a day for men. One drink equals 12 oz of beer, 5 oz of wine, or 1½ oz of hard liquor. °Lifestyle  °· Work with your doctor to stay at a healthy weight or to lose weight. Ask your doctor what the best weight is for you. °· Get at least 30 minutes of exercise that causes your heart to beat faster (aerobic exercise) most days of the week. This may include walking, swimming, or biking. °· Get at least 30 minutes of exercise that strengthens your muscles (resistance exercise) at least 3 days a week. This may include lifting weights or pilates. °· Do not use any  products that contain nicotine or tobacco. This includes cigarettes and e-cigarettes. If you need help quitting, ask your doctor. °· Check your blood pressure at home as told by your doctor. °· Keep all follow-up visits as told by your doctor. This is important. °Medicines  °· Take over-the-counter and prescription medicines only as told by your doctor. Follow directions carefully. °· Do not skip doses of blood pressure medicine. The medicine does not work as well if you skip doses. Skipping doses also puts you at risk for problems. °· Ask your doctor about side effects or reactions to medicines that you should watch for. °Contact a doctor if: °· You think you are having a reaction to the medicine you are taking. °· You have headaches that keep coming back (recurring). °· You feel dizzy. °· You have swelling in your ankles. °· You have trouble with your vision. °Get help right away if: °· You get a very bad headache. °· You start to feel confused. °· You feel weak or numb. °· You feel faint. °· You get very bad pain in your: °¨ Chest. °¨ Belly (abdomen). °· You throw up (vomit) more than once. °· You have trouble breathing. °Summary °· Hypertension is another name for high blood pressure. °· Making healthy choices can help lower blood pressure. If your blood pressure cannot be controlled with healthy choices, you may need to take medicine. °This information is not intended to replace advice given to you by your   health care provider. Make sure you discuss any questions you have with your health care provider. °Document Released: 10/16/2007 Document Revised: 03/27/2016 Document Reviewed: 03/27/2016 °Elsevier Interactive Patient Education © 2017 Elsevier Inc. ° °

## 2016-12-06 ENCOUNTER — Other Ambulatory Visit: Payer: Self-pay | Admitting: Internal Medicine

## 2016-12-06 DIAGNOSIS — M1A0790 Idiopathic chronic gout, unspecified ankle and foot, without tophus (tophi): Secondary | ICD-10-CM

## 2017-02-07 ENCOUNTER — Other Ambulatory Visit: Payer: Self-pay | Admitting: Internal Medicine

## 2017-02-07 DIAGNOSIS — I10 Essential (primary) hypertension: Secondary | ICD-10-CM

## 2017-03-05 ENCOUNTER — Other Ambulatory Visit: Payer: Self-pay | Admitting: Internal Medicine

## 2017-03-05 DIAGNOSIS — I10 Essential (primary) hypertension: Secondary | ICD-10-CM

## 2017-03-05 MED ORDER — LISINOPRIL-HYDROCHLOROTHIAZIDE 10-12.5 MG PO TABS: 1.0000 | ORAL_TABLET | Freq: Every day | ORAL | 0 refills | Status: DC

## 2017-03-10 ENCOUNTER — Ambulatory Visit: Payer: Self-pay | Admitting: Internal Medicine

## 2017-03-14 ENCOUNTER — Encounter: Payer: Self-pay | Admitting: Internal Medicine

## 2017-03-14 ENCOUNTER — Ambulatory Visit (INDEPENDENT_AMBULATORY_CARE_PROVIDER_SITE_OTHER): Payer: PRIVATE HEALTH INSURANCE | Admitting: Internal Medicine

## 2017-03-14 VITALS — BP 124/80 | HR 85 | Temp 98.0°F | Ht 71.25 in | Wt 253.0 lb

## 2017-03-14 DIAGNOSIS — M1A0790 Idiopathic chronic gout, unspecified ankle and foot, without tophus (tophi): Secondary | ICD-10-CM | POA: Diagnosis not present

## 2017-03-14 DIAGNOSIS — J302 Other seasonal allergic rhinitis: Secondary | ICD-10-CM | POA: Diagnosis not present

## 2017-03-14 DIAGNOSIS — I10 Essential (primary) hypertension: Secondary | ICD-10-CM | POA: Diagnosis not present

## 2017-03-14 DIAGNOSIS — Z23 Encounter for immunization: Secondary | ICD-10-CM

## 2017-03-14 DIAGNOSIS — M109 Gout, unspecified: Secondary | ICD-10-CM

## 2017-03-14 DIAGNOSIS — Z125 Encounter for screening for malignant neoplasm of prostate: Secondary | ICD-10-CM

## 2017-03-14 DIAGNOSIS — Z0000 Encounter for general adult medical examination without abnormal findings: Secondary | ICD-10-CM

## 2017-03-14 DIAGNOSIS — K219 Gastro-esophageal reflux disease without esophagitis: Secondary | ICD-10-CM

## 2017-03-14 DIAGNOSIS — E782 Mixed hyperlipidemia: Secondary | ICD-10-CM

## 2017-03-14 LAB — COMPREHENSIVE METABOLIC PANEL
ALT: 51 U/L (ref 0–53)
AST: 35 U/L (ref 0–37)
Albumin: 4.6 g/dL (ref 3.5–5.2)
Alkaline Phosphatase: 61 U/L (ref 39–117)
BILIRUBIN TOTAL: 0.4 mg/dL (ref 0.2–1.2)
BUN: 25 mg/dL — ABNORMAL HIGH (ref 6–23)
CHLORIDE: 98 meq/L (ref 96–112)
CO2: 28 meq/L (ref 19–32)
CREATININE: 1.26 mg/dL (ref 0.40–1.50)
Calcium: 9.6 mg/dL (ref 8.4–10.5)
GFR: 64.33 mL/min (ref >60.00)
GLUCOSE: 90 mg/dL (ref 70–99)
Potassium: 4 mEq/L (ref 3.5–5.1)
SODIUM: 136 meq/L (ref 135–145)
Total Protein: 7.1 g/dL (ref 6.0–8.3)

## 2017-03-14 LAB — LIPID PANEL
Cholesterol: 216 mg/dL — ABNORMAL HIGH (ref 0–200)
HDL: 36.3 mg/dL — AB (ref >39.00)
Total CHOL/HDL Ratio: 6
Triglycerides: 430 mg/dL — ABNORMAL HIGH (ref 0.0–149.0)

## 2017-03-14 LAB — CBC
HCT: 50 % (ref 39.0–52.0)
Hemoglobin: 17.2 g/dL — ABNORMAL HIGH (ref 13.0–17.0)
MCHC: 34.5 g/dL (ref 30.0–36.0)
MCV: 90.9 fl (ref 78.0–100.0)
Platelets: 274 10*3/uL (ref 150.0–400.0)
RBC: 5.5 Mil/uL (ref 4.22–5.81)
RDW: 13.2 % (ref 11.5–15.5)
WBC: 10.9 10*3/uL — ABNORMAL HIGH (ref 4.0–10.5)

## 2017-03-14 LAB — HEMOGLOBIN A1C: HEMOGLOBIN A1C: 5.7 % (ref 4.6–6.5)

## 2017-03-14 LAB — URIC ACID: URIC ACID, SERUM: 8.8 mg/dL — AB (ref 4.0–7.8)

## 2017-03-14 LAB — PSA: PSA: 1.03 ng/mL (ref 0.10–4.00)

## 2017-03-14 LAB — LDL CHOLESTEROL, DIRECT: Direct LDL: 138 mg/dL

## 2017-03-14 MED ORDER — LISINOPRIL-HYDROCHLOROTHIAZIDE 10-12.5 MG PO TABS: 1.0000 | ORAL_TABLET | Freq: Every day | ORAL | 3 refills | Status: DC

## 2017-03-14 NOTE — Assessment & Plan Note (Signed)
Discussed avoiding spicy foods Discussed how weight loss could help improve reflux Continue Pepcid as needed

## 2017-03-14 NOTE — Assessment & Plan Note (Signed)
Uric acid level today Continue Allopurinol, will adjust if needed based on labs 

## 2017-03-14 NOTE — Patient Instructions (Signed)

## 2017-03-14 NOTE — Assessment & Plan Note (Signed)
Controlled on Lisinopril HCT CMET today Medication refilled per request

## 2017-03-14 NOTE — Progress Notes (Signed)
Subjective:    Patient ID: BLAIZE EPPLE, male    DOB: 1966/01/25, 51 y.o.   MRN: [id number]  HPI  Pt presents to the clinic today for his annual exam. He is also due for follow up of chronic conditions.  HTN: His BP today is 124/80. He is taking Lisinopril HCTZ as prescribed. ECG from 07/2016 reviewed.  GERD: Triggered by spicy foods. He takes Pepcid AC as needed with good relief.   Seasonal Allergies: Worse in the spring and fall. He takes Human resources officer and alternates Flonase/Nasocort as needed with good relief.  Insomnia: He is not taking Temazepam anymore.  Gout: His last uric acid level was 7.4, 02/2016. He denies recent gout flare on Allopurinol.  Flu: 02/2016 Tetanus: 10/2008 PSA Screening: never Colon Screening: 09/2013 Vision Screening: as needed Dentist: biannually  Diet: He does eat meat. He consumes some fruits and veggies. He tries to avoid fried foods. He drinks mostly water. Exercise: None  Review of Systems      Past Medical History:  Diagnosis Date  . Adenomatous polyp 2008  . Diabetes mellitus    borderline/ per pt not diabetic!  . Diverticulosis   . GERD (gastroesophageal reflux disease)   . Hyperlipidemia     Current Outpatient Prescriptions  Medication Sig Dispense Refill  . allopurinol (ZYLOPRIM) 300 MG tablet take 1 tablet by mouth once daily 60 tablet 1  . fexofenadine (ALLEGRA) 180 MG tablet Take 180 mg by mouth daily.    . Fluticasone Propionate (FLONASE ALLERGY RELIEF NA) Place into the nose as needed.     Marland Kitchen lisinopril-hydrochlorothiazide (PRINZIDE,ZESTORETIC) 10-12.5 MG tablet Take 1 tablet by mouth daily. MUST SCHEDULE ANNUAL PHYSICAL 30 tablet 0  . meloxicam (MOBIC) 15 MG tablet take 1 tablet by mouth once daily if needed 60 tablet 2  . Multiple Vitamin (MULTIVITAMIN) tablet Take 1 tablet by mouth daily.      . Omega-3 Fatty Acids (FISH OIL) 1000 MG CAPS Take 1,000 mg by mouth 2 (two) times daily.     . temazepam (RESTORIL) 7.5 MG  capsule Take 1 capsule (7.5 mg total) by mouth at bedtime as needed for sleep. 30 capsule 0  . triamcinolone (NASACORT) 55 MCG/ACT AERO nasal inhaler Place 2 sprays into the nose as needed.     No current facility-administered medications for this visit.     No Known Allergies  Family History  Problem Relation Age of Onset  . Colon cancer Father   . Hypertension Father   . Brain cancer Father   . Lung cancer Mother        former smoker  . Hypertension Mother   . Hyperlipidemia Mother   . Colon cancer Paternal Grandfather   . Heart attack Maternal Grandfather        pre 55  . Stroke Maternal Grandfather        not definitely  . Diabetes Neg Hx     Social History   Social History  . Marital status: Married    Spouse name: N/A  . Number of children: N/A  . Years of education: N/A   Occupational History  . Not on file.   Social History Main Topics  . Smoking status: Former Smoker    Types: Cigarettes    Quit date: 05/13/1990  . Smokeless tobacco: Former Systems developer     Comment: smoked [phone], up to 1 ppd; chewing tobacco 1992-2002;intermittent 2003-2012 none since 2012  . Alcohol use 3.6 - 4.2 oz/week  6 - 7 Cans of beer per week     Comment:  occasionally  . Drug use: No  . Sexual activity: Yes   Other Topics Concern  . Not on file   Social History Narrative  . No narrative on file     Constitutional: Pt reports weight gain. Denies fever, malaise, fatigue, headache.  HEENT: Pt reports right ear fullness. Denies eye pain, eye redness, ear pain, ringing in the ears, wax buildup, runny nose, nasal congestion, bloody nose, or sore throat. Respiratory: Denies difficulty breathing, shortness of breath, cough or sputum production.   Cardiovascular: Denies chest pain, chest tightness, palpitations or swelling in the hands or feet.  Gastrointestinal: Denies abdominal pain, bloating, constipation, diarrhea or blood in the stool.  GU: Denies urgency, frequency, pain with  urination, burning sensation, blood in urine, odor or discharge. Musculoskeletal: Denies decrease in range of motion, difficulty with gait, muscle pain or joint pain and swelling.  Skin: Denies redness, rashes, lesions or ulcercations.  Neurological: Denies dizziness, difficulty with memory, difficulty with speech or problems with balance and coordination.  Psych: Denies anxiety, depression, SI/HI.  No other specific complaints in a complete review of systems (except as listed in HPI above).  Objective:   Physical Exam   BP 124/80   Pulse 85   Temp 98 F (36.7 C) (Oral)   Ht 5' 11.25" (1.81 m)   Wt 253 lb (114.8 kg)   SpO2 98%   BMI 35.04 kg/m  Wt Readings from Last 3 Encounters:  03/14/17 253 lb (114.8 kg)  07/29/16 240 lb 12 oz (109.2 kg)  07/08/16 237 lb (107.5 kg)    General: Appears his stated age, obese in NAD. Skin: Warm, dry and intact.  HEENT: Head: normal shape and size; Eyes: sclera white, no icterus, conjunctiva pink, PERRLA and EOMs intact; Right Ear: cerumen impaction, Left Ear: Tm gray and intact, normal light reflex; Nose: mucosa pink and moist, septum midline; Throat/Mouth: Teeth present, mucosa pink and moist, no exudate, lesions or ulcerations noted.  Neck:  Neck supple, trachea midline. No masses, lumps or thyromegaly present.  Cardiovascular: Normal rate and rhythm. S1,S2 noted.  No murmur, rubs or gallops noted. No JVD or BLE edema. No carotid bruits noted. Pulmonary/Chest: Normal effort and positive vesicular breath sounds. No respiratory distress. No wheezes, rales or ronchi noted.  Abdomen: Soft and nontender. Normal bowel sounds. No distention or masses noted. Liver, spleen and kidneys non palpable. Musculoskeletal: Strength 5/5 BUE/BLE. No difficulty with gait.  Neurological: Alert and oriented. Cranial nerves II-XII grossly intact. Coordination normal.  Psychiatric: Mood and affect normal. Behavior is normal. Judgment and thought content normal.      BMET    Component Value Date/Time   NA 137 07/29/2016 0958   K 4.7 07/29/2016 0958   CL 97 07/29/2016 0958   CO2 32 07/29/2016 0958   GLUCOSE 97 07/29/2016 0958   BUN 17 07/29/2016 0958   CREATININE 1.26 07/29/2016 0958   CALCIUM 10.1 07/29/2016 0958   GFRNONAA 70.63 10/26/2008 0000    Lipid Panel     Component Value Date/Time   CHOL 196 02/26/2016 0854   TRIG 227.0 (H) 02/26/2016 0854   HDL 47.30 02/26/2016 0854   CHOLHDL 4 02/26/2016 0854   VLDL 45.4 (H) 02/26/2016 0854   LDLCALC 120 (H) 08/14/2015 1128    CBC    Component Value Date/Time   WBC 6.1 02/26/2016 0854   RBC 5.68 02/26/2016 0854   HGB 17.8 (H)  02/26/2016 0854   HCT 51.2 02/26/2016 0854   PLT 238.0 02/26/2016 0854   MCV 90.0 02/26/2016 0854   MCHC 34.8 02/26/2016 0854   RDW 13.1 02/26/2016 0854   LYMPHSABS 2.0 08/03/2013 0843   MONOABS 0.6 08/03/2013 0843   EOSABS 0.1 08/03/2013 0843   BASOSABS 0.0 08/03/2013 0843    Hgb A1C Lab Results  Component Value Date   HGBA1C 5.3 02/26/2016           Assessment & Plan:   Preventative Health Maintenance:  Flu shot today Tetanus UTD PSA will be added to labs today Colon screening UTD Encouraged him to consume a balanced diet and exercise regimen Advised him to see an eye doctor and dentist annually Will check CBC, CMET, Lipid, and A1C today He declines HIV screening  Right Cerumen Impaction:  Manual lavage by CMA Advised him to try Debrox 2 x week to prevent wax buildup  RTC in 1 year, sooner if needed Webb Silversmith, NP

## 2017-03-14 NOTE — Assessment & Plan Note (Signed)
Continue Allegr and intranasal steroid as needed

## 2017-03-19 MED ORDER — ALLOPURINOL 100 MG PO TABS: 100.0000 mg | ORAL_TABLET | Freq: Every day | ORAL | 0 refills | Status: DC

## 2017-03-19 MED ORDER — ALLOPURINOL 300 MG PO TABS: 300.0000 mg | ORAL_TABLET | Freq: Every day | ORAL | 0 refills | Status: DC

## 2017-03-19 NOTE — Addendum Note (Signed)
Addended by: Lurlean Nanny on: 03/19/2017 05:58 PM   Modules accepted: Orders

## 2017-06-23 ENCOUNTER — Other Ambulatory Visit: Payer: Self-pay | Admitting: Internal Medicine

## 2017-06-23 ENCOUNTER — Other Ambulatory Visit (INDEPENDENT_AMBULATORY_CARE_PROVIDER_SITE_OTHER): Payer: PRIVATE HEALTH INSURANCE

## 2017-06-23 DIAGNOSIS — M1A0790 Idiopathic chronic gout, unspecified ankle and foot, without tophus (tophi): Secondary | ICD-10-CM

## 2017-06-23 DIAGNOSIS — E782 Mixed hyperlipidemia: Secondary | ICD-10-CM | POA: Diagnosis not present

## 2017-06-23 LAB — LIPID PANEL
CHOL/HDL RATIO: 6
Cholesterol: 176 mg/dL (ref 0–200)
HDL: 31 mg/dL — AB (ref >39.00)
LDL CALC: 118 mg/dL — AB (ref 0–99)
NONHDL: 145.42
Triglycerides: 137 mg/dL (ref 0.0–149.0)
VLDL: 27.4 mg/dL (ref 0.0–40.0)

## 2017-06-23 LAB — URIC ACID: Uric Acid, Serum: 7.4 mg/dL (ref 4.0–7.8)

## 2017-06-23 MED ORDER — ALLOPURINOL 300 MG PO TABS: 300.0000 mg | ORAL_TABLET | Freq: Every day | ORAL | 0 refills | Status: DC

## 2017-06-23 MED ORDER — ALLOPURINOL 100 MG PO TABS: 100.0000 mg | ORAL_TABLET | Freq: Every day | ORAL | 0 refills | Status: DC

## 2017-06-23 NOTE — Telephone Encounter (Signed)
[facility]  RF request for allopurinol 100mg  LOV: 03/14/17 Next ov: None Last written: 03/19/17 #90 w/ 0RF  RF request for allopurinol 300mg  Last written: 03/19/17 #90 w/ 0RF  Please advise. Thanks.

## 2017-10-02 ENCOUNTER — Other Ambulatory Visit: Payer: Self-pay | Admitting: Internal Medicine

## 2017-10-02 DIAGNOSIS — M1A0790 Idiopathic chronic gout, unspecified ankle and foot, without tophus (tophi): Secondary | ICD-10-CM

## 2017-10-08 ENCOUNTER — Other Ambulatory Visit: Payer: Self-pay

## 2017-10-08 DIAGNOSIS — M1A0790 Idiopathic chronic gout, unspecified ankle and foot, without tophus (tophi): Secondary | ICD-10-CM

## 2017-10-08 MED ORDER — MELOXICAM 15 MG PO TABS
ORAL_TABLET | ORAL | 2 refills | Status: DC
Start: 2017-10-08 — End: 2018-10-20

## 2017-10-08 NOTE — Telephone Encounter (Signed)
Last filled 04/2016... Last OV CPE 03/2017... Hx of gout... Please advise

## 2018-04-03 ENCOUNTER — Other Ambulatory Visit: Payer: Self-pay | Admitting: Internal Medicine

## 2018-04-03 DIAGNOSIS — I10 Essential (primary) hypertension: Secondary | ICD-10-CM

## 2018-04-03 DIAGNOSIS — M1A0790 Idiopathic chronic gout, unspecified ankle and foot, without tophus (tophi): Secondary | ICD-10-CM

## 2018-07-03 ENCOUNTER — Other Ambulatory Visit: Payer: Self-pay | Admitting: Internal Medicine

## 2018-07-03 DIAGNOSIS — I10 Essential (primary) hypertension: Secondary | ICD-10-CM

## 2018-07-03 DIAGNOSIS — M1A0790 Idiopathic chronic gout, unspecified ankle and foot, without tophus (tophi): Secondary | ICD-10-CM

## 2018-08-05 ENCOUNTER — Telehealth: Payer: Self-pay

## 2018-08-05 NOTE — Telephone Encounter (Signed)
Left message on voicemail  Pt needs to r/s CPE to June or later, no worries on med refills, will refill until appt if needed

## 2018-08-11 ENCOUNTER — Encounter: Payer: PRIVATE HEALTH INSURANCE | Admitting: Internal Medicine

## 2018-10-02 ENCOUNTER — Other Ambulatory Visit: Payer: Self-pay | Admitting: Internal Medicine

## 2018-10-02 DIAGNOSIS — I10 Essential (primary) hypertension: Secondary | ICD-10-CM

## 2018-10-02 DIAGNOSIS — M1A0790 Idiopathic chronic gout, unspecified ankle and foot, without tophus (tophi): Secondary | ICD-10-CM

## 2018-10-20 ENCOUNTER — Encounter: Payer: Self-pay | Admitting: Internal Medicine

## 2018-10-20 ENCOUNTER — Other Ambulatory Visit: Payer: Self-pay

## 2018-10-20 ENCOUNTER — Ambulatory Visit (INDEPENDENT_AMBULATORY_CARE_PROVIDER_SITE_OTHER): Payer: PRIVATE HEALTH INSURANCE | Admitting: Internal Medicine

## 2018-10-20 VITALS — BP 124/84 | HR 73 | Temp 98.2°F | Ht 71.25 in | Wt 252.0 lb

## 2018-10-20 DIAGNOSIS — E782 Mixed hyperlipidemia: Secondary | ICD-10-CM

## 2018-10-20 DIAGNOSIS — K219 Gastro-esophageal reflux disease without esophagitis: Secondary | ICD-10-CM | POA: Diagnosis not present

## 2018-10-20 DIAGNOSIS — Z125 Encounter for screening for malignant neoplasm of prostate: Secondary | ICD-10-CM | POA: Diagnosis not present

## 2018-10-20 DIAGNOSIS — G4700 Insomnia, unspecified: Secondary | ICD-10-CM | POA: Insufficient documentation

## 2018-10-20 DIAGNOSIS — I10 Essential (primary) hypertension: Secondary | ICD-10-CM | POA: Diagnosis not present

## 2018-10-20 DIAGNOSIS — Z0000 Encounter for general adult medical examination without abnormal findings: Secondary | ICD-10-CM

## 2018-10-20 DIAGNOSIS — J302 Other seasonal allergic rhinitis: Secondary | ICD-10-CM

## 2018-10-20 DIAGNOSIS — F5101 Primary insomnia: Secondary | ICD-10-CM

## 2018-10-20 DIAGNOSIS — N529 Male erectile dysfunction, unspecified: Secondary | ICD-10-CM | POA: Diagnosis not present

## 2018-10-20 DIAGNOSIS — M1A09X0 Idiopathic chronic gout, multiple sites, without tophus (tophi): Secondary | ICD-10-CM

## 2018-10-20 MED ORDER — TADALAFIL 20 MG PO TABS: 10.0000 mg | ORAL_TABLET | ORAL | 11 refills | Status: DC | PRN

## 2018-10-20 NOTE — Assessment & Plan Note (Signed)
Continue Lisinopril HCT Reinforced DASH diet and exercise for weight loss CBC and CMET today

## 2018-10-20 NOTE — Assessment & Plan Note (Signed)
Discussed how weight loss could help improve reflux Ok to take Tums as needed CBC and CMET today

## 2018-10-20 NOTE — Patient Instructions (Signed)

## 2018-10-20 NOTE — Assessment & Plan Note (Signed)
Will check uric acid level Continue Allopurinol for now

## 2018-10-20 NOTE — Assessment & Plan Note (Signed)
Continue Allegra and Flonase/Nasocort as needed

## 2018-10-20 NOTE — Progress Notes (Signed)
Subjective:    Patient ID: Stephen Randolph, male    DOB: Mar 14, 1966, 53 y.o.   MRN: [id number]  HPI  Patient presents to the clinic today for his annual exam.  He is also due for a follow up on his chronic conditions.  HTN: His BP today is 124/84. He is taking Lisinopril HCTZ as prescribed. ECG from 07/2016 reviewed.   GERD: Triggered by spicy foods. He takes Pepcid AC as needed with good relief. Upper GI from 2009 reviewed.   Seasonal Allergies: Worse in the spring and fall. He takes Human resources officer and alternates Flonase/Nasocort as needed with good relief.   Insomnia: He has difficulty. He no longer takes Temazepam. There is no sleep study on file.   Gout: His last uric acid level was 7.4, 06/2017. He denies recent gout flare on Allopurinol.  HLD: His last LDL was 118, triglycerides 137,  06/2017.  He is taking Fish Oil and Red Yeast Rice OTC. He tries to consume a low fat diet.    Flu: 03/2018 Tetanus: 10/2008 PSA Screening: 03/2017 Colon Screening: 09/2013 Vision Screening: as needed Dentist: biannually  Diet/Exercise: He does eat meat. He consumes fruits and veggies daily. He tries to avoid fried foods. He drinks mostly water, unsweet tea, coffee. No exercise.   Review of Systems  Past Medical History:  Diagnosis Date  . Adenomatous polyp 2008  . Diabetes mellitus    borderline/ per pt not diabetic!  . Diverticulosis   . GERD (gastroesophageal reflux disease)   . Hyperlipidemia     Current Outpatient Medications  Medication Sig Dispense Refill  . allopurinol (ZYLOPRIM) 100 MG tablet TAKE 1 TABLET BY MOUTH DAILY 90 tablet 0  . allopurinol (ZYLOPRIM) 300 MG tablet TAKE 1 TABLET BY MOUTH DAILY 90 tablet 0  . fexofenadine (ALLEGRA) 180 MG tablet Take 180 mg by mouth daily.    . Fluticasone Propionate (FLONASE ALLERGY RELIEF NA) Place into the nose as needed.     Marland Kitchen lisinopril-hydrochlorothiazide (ZESTORETIC) 10-12.5 MG tablet TAKE 1 TABLET BY MOUTH DAILY 90 tablet 0  .  meloxicam (MOBIC) 15 MG tablet take 1 tablet by mouth once daily if needed 30 tablet 2  . Multiple Vitamin (MULTIVITAMIN) tablet Take 1 tablet by mouth daily.      . Omega-3 Fatty Acids (FISH OIL) 1000 MG CAPS Take 1,000 mg by mouth 2 (two) times daily.     Marland Kitchen triamcinolone (NASACORT) 55 MCG/ACT AERO nasal inhaler Place 2 sprays into the nose as needed.     No current facility-administered medications for this visit.     No Known Allergies  Family History  Problem Relation Age of Onset  . Colon cancer Father   . Hypertension Father   . Brain cancer Father   . Lung cancer Mother        former smoker  . Hypertension Mother   . Hyperlipidemia Mother   . Colon cancer Paternal Grandfather   . Heart attack Maternal Grandfather        pre 55  . Stroke Maternal Grandfather        not definitely  . Diabetes Neg Hx     Social History   Socioeconomic History  . Marital status: Married    Spouse name: Not on file  . Number of children: Not on file  . Years of education: Not on file  . Highest education level: Not on file  Occupational History  . Not on file  Social Needs  . Financial  resource strain: Not on file  . Food insecurity:    Worry: Not on file    Inability: Not on file  . Transportation needs:    Medical: Not on file    Non-medical: Not on file  Tobacco Use  . Smoking status: Former Smoker    Types: Cigarettes    Last attempt to quit: 05/13/1990    Years since quitting: 28.4  . Smokeless tobacco: Former Systems developer  . Tobacco comment: smoked 1984-1992, up to 1 ppd; chewing tobacco 1992-2002;intermittent 2003-2012 none since 2012  Substance and Sexual Activity  . Alcohol use: Yes    Alcohol/week: 6.0 - 7.0 standard drinks    Types: 6 - 7 Cans of beer per week    Comment:  occasionally  . Drug use: No  . Sexual activity: Yes  Lifestyle  . Physical activity:    Days per week: Not on file    Minutes per session: Not on file  . Stress: Not on file  Relationships  .  Social connections:    Talks on phone: Not on file    Gets together: Not on file    Attends religious service: Not on file    Active member of club or organization: Not on file    Attends meetings of clubs or organizations: Not on file    Relationship status: Not on file  . Intimate partner violence:    Fear of current or ex partner: Not on file    Emotionally abused: Not on file    Physically abused: Not on file    Forced sexual activity: Not on file  Other Topics Concern  . Not on file  Social History Narrative  . Not on file     Constitutional: Denies fever, malaise, fatigue, headache or abrupt weight changes.  HEENT: Denies eye pain, eye redness, ear pain, ringing in the ears, wax buildup, runny nose, nasal congestion, bloody nose, or sore throat. Respiratory: Denies difficulty breathing, shortness of breath, cough or sputum production.   Cardiovascular: Denies chest pain, chest tightness, palpitations or swelling in the hands or feet.  Gastrointestinal: Pt reports intermittent reflux. Denies abdominal pain, bloating, constipation, diarrhea or blood in the stool.  GU: Pt reports erectile dysfunction. Denies urgency, frequency, pain with urination, burning sensation, blood in urine, odor or discharge. Musculoskeletal: Denies decrease in range of motion, difficulty with gait, muscle pain or joint pain and swelling.  Skin: Denies redness, rashes, lesions or ulcercations.  Neurological: Pt reports insomnia. Denies dizziness, difficulty with memory, difficulty with speech or problems with balance and coordination.  Psych: Denies anxiety, depression, SI/HI.  No other specific complaints in a complete review of systems (except as listed in HPI above).     Objective:   Physical Exam  BP 124/84   Pulse 73   Temp 98.2 F (36.8 C) (Oral)   Ht 5' 11.25" (1.81 m)   Wt 252 lb (114.3 kg)   SpO2 98%   BMI 34.90 kg/m   Wt Readings from Last 3 Encounters:  03/14/17 253 lb (114.8 kg)   07/29/16 240 lb 12 oz (109.2 kg)  07/08/16 237 lb (107.5 kg)    General: Appears his stated age, obese, in NAD. Skin: Warm, dry and intact. HEENT: Head: normal shape and size; Eyes: sclera white, no icterus, conjunctiva pink, PERRLA and EOMs intact; Ears: Tm's gray and intact, normal light reflex; Neck:  Neck supple, trachea midline. No masses, lumps or thyromegaly present.  Cardiovascular: Normal rate and rhythm. S1,S2  noted.  No murmur, rubs or gallops noted. No JVD or BLE edema. No carotid bruits noted. Pulmonary/Chest: Normal effort and positive vesicular breath sounds. No respiratory distress. No wheezes, rales or ronchi noted.  Abdomen: Soft and nontender. Normal bowel sounds. No distention or masses noted. Liver, spleen and kidneys non palpable. Musculoskeletal: Strength 5/5 BUE/BLE. No difficulty with gait.  Neurological: Alert and oriented. Cranial nerves II-XII grossly intact. Coordination normal.  Psychiatric: Mood and affect normal. Behavior is normal. Judgment and thought content normal.    BMET    Component Value Date/Time   NA 136 03/14/2017 1502   K 4.0 03/14/2017 1502   CL 98 03/14/2017 1502   CO2 28 03/14/2017 1502   GLUCOSE 90 03/14/2017 1502   BUN 25 (H) 03/14/2017 1502   CREATININE 1.26 03/14/2017 1502   CALCIUM 9.6 03/14/2017 1502   GFRNONAA 70.63 10/26/2008 0000    Lipid Panel     Component Value Date/Time   CHOL 176 06/23/2017 0813   TRIG 137.0 06/23/2017 0813   HDL 31.00 (L) 06/23/2017 0813   CHOLHDL 6 06/23/2017 0813   VLDL 27.4 06/23/2017 0813   LDLCALC 118 (H) 06/23/2017 0813    CBC    Component Value Date/Time   WBC 10.9 (H) 03/14/2017 1502   RBC 5.50 03/14/2017 1502   HGB 17.2 (H) 03/14/2017 1502   HCT 50.0 03/14/2017 1502   PLT 274.0 03/14/2017 1502   MCV 90.9 03/14/2017 1502   MCHC 34.5 03/14/2017 1502   RDW 13.2 03/14/2017 1502   LYMPHSABS 2.0 08/03/2013 0843   MONOABS 0.6 08/03/2013 0843   EOSABS 0.1 08/03/2013 0843   BASOSABS  0.0 08/03/2013 0843    Hgb A1C Lab Results  Component Value Date   HGBA1C 5.7 03/14/2017           Assessment & Plan:   Preventative Health Maintenance:  Flu shot UTD Tetanus given He will call to schedule his colonoscopy Encouraged him to consume a balanced diet and exercise regimen Advised him to see an eye doctor and dentist annually Will check CBC, CMET, Lipid, A1C and PSA   RTC in 6 months to follow up chronic conditions. Webb Silversmith, NP

## 2018-10-20 NOTE — Assessment & Plan Note (Signed)
CMET and Lipid profile today Encouraged him to consume a low fat diet Continue Fish Oil and Red Yeast Rice 

## 2018-10-21 LAB — COMPREHENSIVE METABOLIC PANEL
ALT: 51 U/L (ref 0–53)
AST: 42 U/L — ABNORMAL HIGH (ref 0–37)
Albumin: 4.4 g/dL (ref 3.5–5.2)
Alkaline Phosphatase: 67 U/L (ref 39–117)
BUN: 19 mg/dL (ref 6–23)
CO2: 27 mEq/L (ref 19–32)
Calcium: 9.3 mg/dL (ref 8.4–10.5)
Chloride: 100 mEq/L (ref 96–112)
Creatinine, Ser: 1.26 mg/dL (ref 0.40–1.50)
GFR: 60.14 mL/min (ref >60.00)
Glucose, Bld: 105 mg/dL — ABNORMAL HIGH (ref 70–99)
Potassium: 3.8 mEq/L (ref 3.5–5.1)
Sodium: 136 mEq/L (ref 135–145)
Total Bilirubin: 0.4 mg/dL (ref 0.2–1.2)
Total Protein: 6.6 g/dL (ref 6.0–8.3)

## 2018-10-21 LAB — CBC
HCT: 49.4 % (ref 39.0–52.0)
Hemoglobin: 17 g/dL (ref 13.0–17.0)
MCHC: 34.4 g/dL (ref 30.0–36.0)
MCV: 92.5 fl (ref 78.0–100.0)
Platelets: 219 10*3/uL (ref 150.0–400.0)
RBC: 5.34 Mil/uL (ref 4.22–5.81)
RDW: 13.1 % (ref 11.5–15.5)
WBC: 8.4 10*3/uL (ref 4.0–10.5)

## 2018-10-21 LAB — LIPID PANEL
Cholesterol: 194 mg/dL (ref 0–200)
HDL: 35.2 mg/dL — ABNORMAL LOW (ref >39.00)
NonHDL: 158.42
Total CHOL/HDL Ratio: 6
Triglycerides: 229 mg/dL — ABNORMAL HIGH (ref 0.0–149.0)
VLDL: 45.8 mg/dL — ABNORMAL HIGH (ref 0.0–40.0)

## 2018-10-21 LAB — LDL CHOLESTEROL, DIRECT: Direct LDL: 136 mg/dL

## 2018-10-21 LAB — HEMOGLOBIN A1C: Hgb A1c MFr Bld: 5.8 % (ref 4.6–6.5)

## 2018-10-21 LAB — URIC ACID: Uric Acid, Serum: 6.7 mg/dL (ref 4.0–7.8)

## 2018-10-21 LAB — PSA: PSA: 0.94 ng/mL (ref 0.10–4.00)

## 2018-10-26 ENCOUNTER — Encounter: Payer: Self-pay | Admitting: Internal Medicine

## 2018-11-17 ENCOUNTER — Ambulatory Visit (AMBULATORY_SURGERY_CENTER): Payer: Self-pay

## 2018-11-17 ENCOUNTER — Other Ambulatory Visit: Payer: Self-pay

## 2018-11-17 VITALS — Ht 72.0 in | Wt 250.0 lb

## 2018-11-17 DIAGNOSIS — Z86010 Personal history of colonic polyps: Secondary | ICD-10-CM

## 2018-11-17 DIAGNOSIS — Z800 Family history of malignant neoplasm of digestive organs: Secondary | ICD-10-CM

## 2018-11-17 MED ORDER — NA SULFATE-K SULFATE-MG SULF 17.5-3.13-1.6 GM/177ML PO SOLN: 1.0000 | Freq: Once | ORAL | 0 refills | Status: AC

## 2018-11-17 NOTE — Progress Notes (Signed)
Denies allergies to eggs or soy products. Denies complication of anesthesia or sedation. Denies use of weight loss medication. Denies use of O2.   Emmi instructions given for colonoscopy.  Pre-Visit was conducted by phone due to Covid 19. Instructions were reviewed with patient and mailed to confirmed mailing address. A 15.00 coupon for Suprep was given to the patient. Patient was encouraged to call if he had any questions regarding instructions.

## 2018-11-30 ENCOUNTER — Telehealth: Payer: Self-pay | Admitting: Internal Medicine

## 2018-12-01 ENCOUNTER — Encounter: Payer: PRIVATE HEALTH INSURANCE | Admitting: Internal Medicine

## 2019-01-07 ENCOUNTER — Other Ambulatory Visit: Payer: Self-pay

## 2019-01-07 DIAGNOSIS — M1A0790 Idiopathic chronic gout, unspecified ankle and foot, without tophus (tophi): Secondary | ICD-10-CM

## 2019-01-07 DIAGNOSIS — I10 Essential (primary) hypertension: Secondary | ICD-10-CM

## 2019-01-07 MED ORDER — ALLOPURINOL 300 MG PO TABS: 300.0000 mg | ORAL_TABLET | Freq: Every day | ORAL | 0 refills | Status: DC

## 2019-01-07 MED ORDER — LISINOPRIL-HYDROCHLOROTHIAZIDE 10-12.5 MG PO TABS: 1.0000 | ORAL_TABLET | Freq: Every day | ORAL | 0 refills | Status: DC

## 2019-01-07 MED ORDER — ALLOPURINOL 100 MG PO TABS: 100.0000 mg | ORAL_TABLET | Freq: Every day | ORAL | 0 refills | Status: DC

## 2019-01-07 NOTE — Telephone Encounter (Signed)
Pt transferred all of his rxs to [facility] but the Allopurinol Both doses and lisinopril-hctz did not have any refills. Asked that they be sent to [facility].

## 2019-04-07 ENCOUNTER — Telehealth: Payer: Self-pay | Admitting: Internal Medicine

## 2019-04-07 DIAGNOSIS — I10 Essential (primary) hypertension: Secondary | ICD-10-CM

## 2019-04-07 DIAGNOSIS — M1A0790 Idiopathic chronic gout, unspecified ankle and foot, without tophus (tophi): Secondary | ICD-10-CM

## 2019-04-07 MED ORDER — LISINOPRIL-HYDROCHLOROTHIAZIDE 10-12.5 MG PO TABS: 1.0000 | ORAL_TABLET | Freq: Every day | ORAL | 0 refills | Status: DC

## 2019-04-07 MED ORDER — ALLOPURINOL 300 MG PO TABS: 300.0000 mg | ORAL_TABLET | Freq: Every day | ORAL | 0 refills | Status: DC

## 2019-04-07 MED ORDER — ALLOPURINOL 100 MG PO TABS: 100.0000 mg | ORAL_TABLET | Freq: Every day | ORAL | 0 refills | Status: DC

## 2019-04-07 NOTE — Telephone Encounter (Signed)
Pt called checking on rx  He stated drug store faxed request last week  Allopurinol 300 and 100 Lisinopril  Pt is out of meds  [place]

## 2019-04-07 NOTE — Telephone Encounter (Signed)
Rx sent through [facility] Left message on voicemail We have not received refill request electronically or fax

## 2019-04-26 ENCOUNTER — Encounter: Payer: Self-pay | Admitting: Internal Medicine

## 2019-05-26 ENCOUNTER — Encounter: Payer: Self-pay | Admitting: Internal Medicine

## 2019-05-29 ENCOUNTER — Other Ambulatory Visit: Payer: Self-pay | Admitting: Internal Medicine

## 2019-05-29 DIAGNOSIS — I10 Essential (primary) hypertension: Secondary | ICD-10-CM

## 2019-05-29 DIAGNOSIS — M1A0790 Idiopathic chronic gout, unspecified ankle and foot, without tophus (tophi): Secondary | ICD-10-CM

## 2019-06-29 ENCOUNTER — Other Ambulatory Visit: Payer: Self-pay | Admitting: Internal Medicine

## 2019-09-10 ENCOUNTER — Encounter: Payer: Self-pay | Admitting: Internal Medicine

## 2019-09-24 ENCOUNTER — Other Ambulatory Visit: Payer: Self-pay | Admitting: Internal Medicine

## 2019-09-24 DIAGNOSIS — M1A0790 Idiopathic chronic gout, unspecified ankle and foot, without tophus (tophi): Secondary | ICD-10-CM

## 2019-09-24 DIAGNOSIS — I10 Essential (primary) hypertension: Secondary | ICD-10-CM

## 2019-10-05 ENCOUNTER — Other Ambulatory Visit: Payer: Self-pay | Admitting: Internal Medicine

## 2019-10-05 DIAGNOSIS — M1A0790 Idiopathic chronic gout, unspecified ankle and foot, without tophus (tophi): Secondary | ICD-10-CM

## 2019-10-05 DIAGNOSIS — I10 Essential (primary) hypertension: Secondary | ICD-10-CM

## 2019-10-05 NOTE — Telephone Encounter (Signed)
[facility] would like a call back regarding request for allopurinol and lisinopril prescriptions that they have sent over several times. Pt is asking them when this will be refilled.

## 2019-10-28 ENCOUNTER — Ambulatory Visit (AMBULATORY_SURGERY_CENTER): Payer: Self-pay | Admitting: *Deleted

## 2019-10-28 ENCOUNTER — Other Ambulatory Visit: Payer: Self-pay

## 2019-10-28 VITALS — Ht 72.0 in | Wt 264.4 lb

## 2019-10-28 DIAGNOSIS — Z800 Family history of malignant neoplasm of digestive organs: Secondary | ICD-10-CM

## 2019-10-28 DIAGNOSIS — Z860100 Personal history of colon polyps, unspecified: Secondary | ICD-10-CM

## 2019-10-28 DIAGNOSIS — Z01818 Encounter for other preprocedural examination: Secondary | ICD-10-CM

## 2019-10-28 MED ORDER — SUTAB 1479-225-188 MG PO TABS: 1.0000 | ORAL_TABLET | ORAL | 0 refills | Status: DC

## 2019-10-28 NOTE — Progress Notes (Signed)
Patient denies any allergies to egg or soy products. Patient denies complications with anesthesia/sedation.  Patient denies oxygen use at home and denies diet medications. Emmi instructions for colonoscopy explained and given to patient.  [place] coupon given at [facility] appt.

## 2019-11-06 ENCOUNTER — Other Ambulatory Visit: Payer: Self-pay | Admitting: Internal Medicine

## 2019-11-06 NOTE — Telephone Encounter (Signed)
Last filled 10/20/2018 with 11 refills, note saying pt must schedule physical exam...Marland Kitchen please advise

## 2019-11-09 ENCOUNTER — Other Ambulatory Visit: Payer: Self-pay | Admitting: Internal Medicine

## 2019-11-09 ENCOUNTER — Ambulatory Visit (INDEPENDENT_AMBULATORY_CARE_PROVIDER_SITE_OTHER): Payer: PRIVATE HEALTH INSURANCE

## 2019-11-09 DIAGNOSIS — Z1159 Encounter for screening for other viral diseases: Secondary | ICD-10-CM

## 2019-11-09 LAB — SARS CORONAVIRUS 2 (TAT 6-24 HRS): SARS Coronavirus 2: NEGATIVE

## 2019-11-12 ENCOUNTER — Encounter: Payer: Self-pay | Admitting: Internal Medicine

## 2019-11-12 ENCOUNTER — Ambulatory Visit (AMBULATORY_SURGERY_CENTER): Payer: PRIVATE HEALTH INSURANCE | Admitting: Internal Medicine

## 2019-11-12 ENCOUNTER — Other Ambulatory Visit: Payer: Self-pay

## 2019-11-12 ENCOUNTER — Other Ambulatory Visit: Payer: Self-pay | Admitting: Internal Medicine

## 2019-11-12 VITALS — BP 121/82 | HR 72 | Temp 97.5°F | Resp 21 | Ht 72.0 in | Wt 264.4 lb

## 2019-11-12 DIAGNOSIS — Z86010 Personal history of colonic polyps: Secondary | ICD-10-CM | POA: Diagnosis not present

## 2019-11-12 DIAGNOSIS — Z800 Family history of malignant neoplasm of digestive organs: Secondary | ICD-10-CM | POA: Diagnosis not present

## 2019-11-12 DIAGNOSIS — D122 Benign neoplasm of ascending colon: Secondary | ICD-10-CM | POA: Diagnosis not present

## 2019-11-12 MED ORDER — SODIUM CHLORIDE 0.9 % IV SOLN: 500.0000 mL | Freq: Once | INTRAVENOUS | Status: AC

## 2019-11-12 NOTE — Progress Notes (Signed)
Report to PACU, RN, vss, BBS= Clear.  

## 2019-11-12 NOTE — Progress Notes (Signed)
Called to room to assist during endoscopic procedure.  Patient ID and intended procedure confirmed with present staff. Received instructions for my participation in the procedure from the performing physician.  

## 2019-11-12 NOTE — Op Note (Signed)
[place] Patient Name: Stephen Randolph Procedure Date: 11/12/2019 9:27 AM MRN: [id number] Endoscopist: Docia Chuck. Henrene Pastor , MD Age: 54 Referring MD:  Date of Birth: [birth year] Gender: Male Account #: [id number] Procedure:                Colonoscopy with cold snare polypectomy x 1 Indications:              .. Surveillance colonoscopy. Personal history of                            adenomatous colon polyps (greater than 3). Also                            father with colon cancer in 62s and grandfather                            with a history of colon cancer. Previous                            examinations 2009, 2015 Medicines:                Monitored Anesthesia Care Procedure:                Pre-Anesthesia Assessment:                           - Prior to the procedure, a History and Physical                            was performed, and patient medications and                            allergies were reviewed. The patient's tolerance of                            previous anesthesia was also reviewed. The risks                            and benefits of the procedure and the sedation                            options and risks were discussed with the patient.                            All questions were answered, and informed consent                            was obtained. Prior Anticoagulants: The patient has                            taken no previous anticoagulant or antiplatelet                            agents. ASA Grade Assessment: II - A patient with  mild systemic disease. After reviewing the risks                            and benefits, the patient was deemed in                            satisfactory condition to undergo the procedure.                           After obtaining informed consent, the colonoscope                            was passed under direct vision. Throughout the                            procedure, the  patient's blood pressure, pulse, and                            oxygen saturations were monitored continuously. The                            Colonoscope was introduced through the anus and                            advanced to the the cecum, identified by                            appendiceal orifice and ileocecal valve. The                            ileocecal valve, appendiceal orifice, and rectum                            were photographed. The quality of the bowel                            preparation was good. The colonoscopy was performed                            without difficulty. The patient tolerated the                            procedure well. The bowel preparation used was                            SUPREP via split dose instruction. Scope In: 9:33:00 AM Scope Out: 9:45:03 AM Scope Withdrawal Time: 0 hours 7 minutes 18 seconds  Total Procedure Duration: 0 hours 12 minutes 3 seconds  Findings:                 A 5 mm polyp was found in the ascending colon. The                            polyp was pedunculated. The polyp was removed with  a cold snare. Resection and retrieval were complete.                           Multiple diverticula were found in the sigmoid                            colon and right colon.                           The exam was otherwise without abnormality on                            direct and retroflexion views. Complications:            No immediate complications. Estimated blood loss:                            None. Estimated Blood Loss:     Estimated blood loss: none. Impression:               - One 5 mm polyp in the ascending colon, removed                            with a cold snare. Resected and retrieved.                           - Diverticulosis in the sigmoid colon and in the                            right colon.                           - The examination was otherwise normal on direct                             and retroflexion views. Recommendation:           - Repeat colonoscopy in 5 years for surveillance.                           - Patient has a contact number available for                            emergencies. The signs and symptoms of potential                            delayed complications were discussed with the                            patient. Return to normal activities tomorrow.                            Written discharge instructions were provided to the                            patient.                           -  Resume previous diet.                           - Continue present medications.                           - Await pathology results. Docia Chuck. Henrene Pastor, MD 11/12/2019 9:50:48 AM This report has been signed electronically.

## 2019-11-12 NOTE — Patient Instructions (Signed)

## 2019-11-12 NOTE — Progress Notes (Signed)
VS-CW  Pt's states no medical or surgical changes since previsit or office visit.  

## 2019-11-17 ENCOUNTER — Telehealth: Payer: Self-pay | Admitting: *Deleted

## 2019-11-17 ENCOUNTER — Encounter: Payer: Self-pay | Admitting: Internal Medicine

## 2019-11-17 NOTE — Telephone Encounter (Signed)
Attempted 2nd f/u phone call. No answer. Left message.  °

## 2019-11-17 NOTE — Telephone Encounter (Signed)
Message left

## 2019-11-27 ENCOUNTER — Other Ambulatory Visit: Payer: Self-pay | Admitting: Internal Medicine

## 2019-11-27 DIAGNOSIS — M1A0790 Idiopathic chronic gout, unspecified ankle and foot, without tophus (tophi): Secondary | ICD-10-CM

## 2019-11-27 DIAGNOSIS — I10 Essential (primary) hypertension: Secondary | ICD-10-CM

## 2019-12-29 ENCOUNTER — Other Ambulatory Visit: Payer: Self-pay | Admitting: Internal Medicine

## 2019-12-29 DIAGNOSIS — M1A0790 Idiopathic chronic gout, unspecified ankle and foot, without tophus (tophi): Secondary | ICD-10-CM

## 2019-12-29 DIAGNOSIS — I10 Essential (primary) hypertension: Secondary | ICD-10-CM

## 2020-02-29 ENCOUNTER — Ambulatory Visit (INDEPENDENT_AMBULATORY_CARE_PROVIDER_SITE_OTHER)
Admission: RE | Admit: 2020-02-29 | Discharge: 2020-02-29 | Disposition: A | Discharge status: Home / Self Care | Payer: PRIVATE HEALTH INSURANCE | Source: Ambulatory Visit | Attending: Internal Medicine | Admitting: Internal Medicine

## 2020-02-29 ENCOUNTER — Ambulatory Visit (INDEPENDENT_AMBULATORY_CARE_PROVIDER_SITE_OTHER): Payer: PRIVATE HEALTH INSURANCE | Admitting: Internal Medicine

## 2020-02-29 ENCOUNTER — Other Ambulatory Visit: Payer: Self-pay

## 2020-02-29 ENCOUNTER — Other Ambulatory Visit: Payer: Self-pay | Admitting: Internal Medicine

## 2020-02-29 ENCOUNTER — Encounter: Payer: Self-pay | Admitting: Internal Medicine

## 2020-02-29 VITALS — BP 132/84 | HR 80 | Temp 98.5°F | Ht 71.25 in | Wt 267.0 lb

## 2020-02-29 DIAGNOSIS — R0602 Shortness of breath: Secondary | ICD-10-CM

## 2020-02-29 DIAGNOSIS — F5101 Primary insomnia: Secondary | ICD-10-CM

## 2020-02-29 DIAGNOSIS — Z125 Encounter for screening for malignant neoplasm of prostate: Secondary | ICD-10-CM

## 2020-02-29 DIAGNOSIS — K219 Gastro-esophageal reflux disease without esophagitis: Secondary | ICD-10-CM | POA: Diagnosis not present

## 2020-02-29 DIAGNOSIS — E782 Mixed hyperlipidemia: Secondary | ICD-10-CM | POA: Diagnosis not present

## 2020-02-29 DIAGNOSIS — R0683 Snoring: Secondary | ICD-10-CM

## 2020-02-29 DIAGNOSIS — M1A09X0 Idiopathic chronic gout, multiple sites, without tophus (tophi): Secondary | ICD-10-CM

## 2020-02-29 DIAGNOSIS — I10 Essential (primary) hypertension: Secondary | ICD-10-CM

## 2020-02-29 DIAGNOSIS — Z0000 Encounter for general adult medical examination without abnormal findings: Secondary | ICD-10-CM

## 2020-02-29 NOTE — Assessment & Plan Note (Signed)
Referral to pulmonology for sleep study 

## 2020-02-29 NOTE — Assessment & Plan Note (Signed)
Uric acid level today Continue Allopurinol

## 2020-02-29 NOTE — Assessment & Plan Note (Signed)
Controlled on Lisinopril HCT CMET today Reinforced DASH diet and exercise for weight loss

## 2020-02-29 NOTE — Patient Instructions (Signed)

## 2020-02-29 NOTE — Progress Notes (Signed)
Subjective:    Patient ID: Stephen Randolph, male    DOB: 07/27/1965, 54 y.o.   MRN: [id number]  HPI  Pt presents to the clinic today for his annual exam. He is also due to follow up chronic conditions.  HTN: His BP today is 132/84. He is taking Lisinopril HCT as prescribed. ECG from 07/2016 reviewed.  GERD: Triggered by spicy foods. He take Famotidine as needed with good relief. Upper GI from 2009 reviewed.  Insomnia: He has difficulty staying asleep. He is not taking any medications for this. He is concerned that he may have sleep apnea. He snores and often does not feel rested when he wakes up. There is no sleep study on file.  Gout: He denies recent flare on Allopurinol.   HLD: His last LDL was 136, 10/2018. He is taking Fish Oil, Red Yeast Rice OTC. He tries to consume a low fat diet.  Flu: 03/2019 Tetanus: 10/2018 Covid: never PSA Screening: 10/2018 Colon Screening: 11/2019 Vision Screening: annually Dentist: biannually  Diet: He does eat meat. He consumes fruits and veggies daily. He tries to avoid fried foods.  Review of Systems      Past Medical History:  Diagnosis Date  . Adenomatous polyp 2008  . Allergy   . Diverticulosis   . GERD (gastroesophageal reflux disease)   . Hyperlipidemia   . Hypertension     Current Outpatient Medications  Medication Sig Dispense Refill  . allopurinol (ZYLOPRIM) 100 MG tablet TAKE ONE TABLET (100MG  TOTAL) BY MOUTH DAILY 90 tablet 0  . allopurinol (ZYLOPRIM) 300 MG tablet TAKE ONE TABLET (300MG  TOTAL) BY MOUTH DAILY 90 tablet 0  . fexofenadine (ALLEGRA) 180 MG tablet Take 180 mg by mouth daily.    . Fluticasone Propionate (FLONASE ALLERGY RELIEF NA) Place into the nose as needed.     Marland Kitchen lisinopril-hydrochlorothiazide (ZESTORETIC) 10-12.5 MG tablet TAKE ONE TABLET BY MOUTH DAILY 90 tablet 0  . Multiple Vitamin (MULTIVITAMIN) tablet Take 1 tablet by mouth daily.      . Omega-3 Fatty Acids (FISH OIL) 1000 MG CAPS Take 1,000 mg  by mouth 2 (two) times daily.     Marland Kitchen OVER THE COUNTER MEDICATION Red Yeast Rice, 1200 mg, two capsules daily.    Marland Kitchen OVER THE COUNTER MEDICATION Vitamin D 3 one capsule daily.    . tadalafil (CIALIS) 20 MG tablet TAKE 1/2 TO 1 TAB (10 TO 20MG ) BY MOUTH EVERY OTHER DAY AS NEEDED FOR ERECTILE DYSFUNCTION. 5 tablet 0  . triamcinolone (NASACORT) 55 MCG/ACT AERO nasal inhaler Place 2 sprays into the nose as needed.     Current Facility-Administered Medications  Medication Dose Route Frequency Provider Last Rate Last Admin  . 0.9 %  sodium chloride infusion  500 mL Intravenous Once Irene Shipper, MD        No Known Allergies  Family History  Problem Relation Age of Onset  . Colon cancer Father   . Hypertension Father   . Brain cancer Father   . Lung cancer Mother        former smoker  . Hypertension Mother   . Hyperlipidemia Mother   . Colon cancer Paternal Grandfather   . Heart attack Maternal Grandfather        pre 55  . Stroke Maternal Grandfather        not definitely  . Diabetes Neg Hx   . Esophageal cancer Neg Hx   . Stomach cancer Neg Hx   . Rectal cancer  Neg Hx     Social History   Socioeconomic History  . Marital status: Married    Spouse name: Not on file  . Number of children: Not on file  . Years of education: Not on file  . Highest education level: Not on file  Occupational History  . Not on file  Tobacco Use  . Smoking status: Former Smoker    Types: Cigarettes    Quit date: 05/13/1990    Years since quitting: 29.8  . Smokeless tobacco: Former Systems developer    Quit date: 2018  . Tobacco comment: smoked 1984-1992, up to 1 ppd; chewing tobacco 1992-2002;intermittent 2003-2012 none since 2012  Vaping Use  . Vaping Use: Never used  Substance and Sexual Activity  . Alcohol use: Yes    Alcohol/week: 6.0 - 7.0 standard drinks    Types: 6 - 7 Cans of beer per week    Comment:  occasionally  . Drug use: No  . Sexual activity: Yes  Other Topics Concern  . Not on file    Social History Narrative  . Not on file   Social Determinants of Health   Financial Resource Strain:   . Difficulty of Paying Living Expenses: Not on file  Food Insecurity:   . Worried About Charity fundraiser in the Last Year: Not on file  . Ran Out of Food in the Last Year: Not on file  Transportation Needs:   . Lack of Transportation (Medical): Not on file  . Lack of Transportation (Non-Medical): Not on file  Physical Activity:   . Days of Exercise per Week: Not on file  . Minutes of Exercise per Session: Not on file  Stress:   . Feeling of Stress : Not on file  Social Connections:   . Frequency of Communication with Friends and Family: Not on file  . Frequency of Social Gatherings with Friends and Family: Not on file  . Attends Religious Services: Not on file  . Active Member of Clubs or Organizations: Not on file  . Attends Archivist Meetings: Not on file  . Marital Status: Not on file  Intimate Partner Violence:   . Fear of Current or Ex-Partner: Not on file  . Emotionally Abused: Not on file  . Physically Abused: Not on file  . Sexually Abused: Not on file     Constitutional: Denies fever, malaise, fatigue, headache or abrupt weight changes.  HEENT: Denies eye pain, eye redness, ear pain, ringing in the ears, wax buildup, runny nose, nasal congestion, bloody nose, or sore throat. Respiratory: Pt reports shortness of breath with bending over/exertion. Denies difficulty breathing,, cough or sputum production.   Cardiovascular: Denies chest pain, chest tightness, palpitations or swelling in the hands or feet.  Gastrointestinal: Denies abdominal pain, bloating, constipation, diarrhea or blood in the stool.  GU: Denies urgency, frequency, pain with urination, burning sensation, blood in urine, odor or discharge. Musculoskeletal: Denies decrease in range of motion, difficulty with gait, muscle pain or joint pain and swelling.  Skin: Denies redness, rashes,  lesions or ulcercations.  Neurological: Pt reports insomnia. Denies dizziness, difficulty with memory, difficulty with speech or problems with balance and coordination.  Psych: Denies anxiety, depression, SI/HI.  No other specific complaints in a complete review of systems (except as listed in HPI above).  Objective:   Physical Exam  BP 132/84   Pulse 80   Temp 98.5 F (36.9 C) (Temporal)   Ht 5' 11.25" (1.81 m)   Wt  267 lb (121.1 kg)   SpO2 97%   BMI 36.98 kg/m   Wt Readings from Last 3 Encounters:  11/12/19 264 lb 6.4 oz (119.9 kg)  10/28/19 264 lb 6.4 oz (119.9 kg)  11/17/18 250 lb (113.4 kg)    General: Appears his stated age, obese, in NAD. Skin: Warm, dry and intact. No rashesnoted. HEENT: Head: normal shape and size; Eyes: sclera white, no icterus, conjunctiva pink, PERRLA and EOMs intact;  Neck:  Neck supple, trachea midline. No masses, lumps or thyromegaly present.  Cardiovascular: Normal rate and rhythm. S1,S2 noted.  No murmur, rubs or gallops noted. No JVD or BLE edema. No carotid bruits noted. Pulmonary/Chest: Normal effort and positive vesicular breath sounds. No respiratory distress. No wheezes, rales or ronchi noted.  Abdomen: Soft and nontender. Normal bowel sounds. No distention or masses noted. Liver, spleen and kidneys non palpable. Musculoskeletal: Strength 5/5 BUE/BLE. No difficulty with gait.  Neurological: Alert and oriented. Cranial nerves II-XII grossly intact. Coordination normal.  Psychiatric: Mood and affect normal. Behavior is normal. Judgment and thought content normal.     BMET    Component Value Date/Time   NA 136 10/20/2018 1556   K 3.8 10/20/2018 1556   CL 100 10/20/2018 1556   CO2 27 10/20/2018 1556   GLUCOSE 105 (H) 10/20/2018 1556   BUN 19 10/20/2018 1556   CREATININE 1.26 10/20/2018 1556   CALCIUM 9.3 10/20/2018 1556   GFRNONAA 70.63 10/26/2008 0000    Lipid Panel     Component Value Date/Time   CHOL 194 10/20/2018 1556    TRIG 229.0 (H) 10/20/2018 1556   HDL 35.20 (L) 10/20/2018 1556   CHOLHDL 6 10/20/2018 1556   VLDL 45.8 (H) 10/20/2018 1556   LDLCALC 118 (H) 06/23/2017 0813    CBC    Component Value Date/Time   WBC 8.4 10/20/2018 1556   RBC 5.34 10/20/2018 1556   HGB 17.0 10/20/2018 1556   HCT 49.4 10/20/2018 1556   PLT 219.0 10/20/2018 1556   MCV 92.5 10/20/2018 1556   MCHC 34.4 10/20/2018 1556   RDW 13.1 10/20/2018 1556   LYMPHSABS 2.0 08/03/2013 0843   MONOABS 0.6 08/03/2013 0843   EOSABS 0.1 08/03/2013 0843   BASOSABS 0.0 08/03/2013 0843    Hgb A1C Lab Results  Component Value Date   HGBA1C 5.8 10/20/2018           Assessment & Plan:   Preventative Health Maintenance:  He declines flu shot today Tetanus UTD He declines covid vaccine Colon screening UTD Encouraged him to consume a balanced diet and exercise regimen Advised him to see an eye doctor and dentist annually Will check CBC, CMET, TSH, Lipid, A1C and PSA today  Shortness of Breath with Exertion:  Chest xray today CT cardiac scoring  Snoring:  Neck measurement: 17 inches ESS score of 10 Referral to pulmonology for sleep study  RTC in 1 year, sooner if needed Webb Silversmith, NP This visit occurred during the SARS-CoV-2 public health emergency.  Safety protocols were in place, including screening questions prior to the visit, additional usage of staff PPE, and extensive cleaning of exam room while observing appropriate contact time as indicated for disinfecting solutions.

## 2020-02-29 NOTE — Assessment & Plan Note (Signed)
Encouraged use of daily Famotidine CBC and CMET today Avoid foods that trigger reflux Weight loss can help reduce reflux symptoms

## 2020-02-29 NOTE — Assessment & Plan Note (Signed)
CMET and Lipid profile today Encouraged him to consume a low fat diet Continue Fish Oil and Red Yeast Rice

## 2020-03-01 LAB — CBC
HCT: 51.7 % (ref 39.0–52.0)
Hemoglobin: 17.8 g/dL — ABNORMAL HIGH (ref 13.0–17.0)
MCHC: 34.4 g/dL (ref 30.0–36.0)
MCV: 92.3 fl (ref 78.0–100.0)
Platelets: 226 10*3/uL (ref 150.0–400.0)
RBC: 5.61 Mil/uL (ref 4.22–5.81)
RDW: 13.3 % (ref 11.5–15.5)
WBC: 9.3 10*3/uL (ref 4.0–10.5)

## 2020-03-01 LAB — LIPID PANEL
Cholesterol: 214 mg/dL — ABNORMAL HIGH (ref 0–200)
HDL: 41.4 mg/dL (ref >39.00)
NonHDL: 172.17
Total CHOL/HDL Ratio: 5
Triglycerides: 291 mg/dL — ABNORMAL HIGH (ref 0.0–149.0)
VLDL: 58.2 mg/dL — ABNORMAL HIGH (ref 0.0–40.0)

## 2020-03-01 LAB — COMPREHENSIVE METABOLIC PANEL
ALT: 61 U/L — ABNORMAL HIGH (ref 0–53)
AST: 43 U/L — ABNORMAL HIGH (ref 0–37)
Albumin: 4.6 g/dL (ref 3.5–5.2)
Alkaline Phosphatase: 72 U/L (ref 39–117)
BUN: 16 mg/dL (ref 6–23)
CO2: 29 mEq/L (ref 19–32)
Calcium: 9.5 mg/dL (ref 8.4–10.5)
Chloride: 97 mEq/L (ref 96–112)
Creatinine, Ser: 1.21 mg/dL (ref 0.40–1.50)
GFR: 67.86 mL/min (ref >60.00)
Glucose, Bld: 83 mg/dL (ref 70–99)
Potassium: 3.7 mEq/L (ref 3.5–5.1)
Sodium: 137 mEq/L (ref 135–145)
Total Bilirubin: 0.6 mg/dL (ref 0.2–1.2)
Total Protein: 6.9 g/dL (ref 6.0–8.3)

## 2020-03-01 LAB — PSA: PSA: 0.75 ng/mL (ref 0.10–4.00)

## 2020-03-01 LAB — LDL CHOLESTEROL, DIRECT: Direct LDL: 144 mg/dL

## 2020-03-01 LAB — HEMOGLOBIN A1C: Hgb A1c MFr Bld: 6.1 % (ref 4.6–6.5)

## 2020-03-01 LAB — TSH: TSH: 0.98 u[IU]/mL (ref 0.35–4.50)

## 2020-03-01 LAB — URIC ACID: Uric Acid, Serum: 6.1 mg/dL (ref 4.0–7.8)

## 2020-03-01 NOTE — Telephone Encounter (Signed)
Last filled 11/06/2019... please advise

## 2020-03-02 MED ORDER — TADALAFIL 20 MG PO TABS
10.0000 mg | ORAL_TABLET | Freq: Every day | ORAL | 5 refills | Status: DC | PRN
Start: 2020-03-02 — End: 2020-08-28

## 2020-03-03 ENCOUNTER — Other Ambulatory Visit: Payer: Self-pay | Admitting: Internal Medicine

## 2020-03-03 ENCOUNTER — Encounter: Payer: Self-pay | Admitting: Internal Medicine

## 2020-03-03 DIAGNOSIS — R0602 Shortness of breath: Secondary | ICD-10-CM

## 2020-03-16 ENCOUNTER — Other Ambulatory Visit: Payer: Self-pay

## 2020-03-16 ENCOUNTER — Ambulatory Visit (INDEPENDENT_AMBULATORY_CARE_PROVIDER_SITE_OTHER)
Admission: RE | Admit: 2020-03-16 | Discharge: 2020-03-16 | Disposition: A | Discharge status: Home / Self Care | Payer: Self-pay | Source: Ambulatory Visit | Attending: Internal Medicine | Admitting: Internal Medicine

## 2020-03-16 ENCOUNTER — Encounter: Payer: Self-pay | Admitting: Internal Medicine

## 2020-03-16 DIAGNOSIS — R0602 Shortness of breath: Secondary | ICD-10-CM

## 2020-03-28 ENCOUNTER — Other Ambulatory Visit: Payer: Self-pay | Admitting: Internal Medicine

## 2020-03-28 DIAGNOSIS — I10 Essential (primary) hypertension: Secondary | ICD-10-CM

## 2020-03-31 ENCOUNTER — Other Ambulatory Visit: Payer: Self-pay | Admitting: Internal Medicine

## 2020-03-31 DIAGNOSIS — M1A0790 Idiopathic chronic gout, unspecified ankle and foot, without tophus (tophi): Secondary | ICD-10-CM

## 2020-04-26 NOTE — Progress Notes (Signed)
04/27/20- 71 yoM former smoker for sleep evaluation courtesy of Webb Silversmith, NP with concern of snoring. Medical problem list includes Insomnia, HTN, Diverticulosis, GERD, Gout, Hyperlipidemia, Seasonal Allergy,  Epworth score- 15 Body weight today- 268 lbs Covid vax- none Flu vax- declined -----Snoring and witnessed apneas for a few years. Girl friend tells him of witnessed apneas and snoring.  Some daytime tiredness with desk work.  No complex parasomnias.  ENT surgery- 2001+ sinus surgery and septoplasty Some DOE blamed on weight gain with little cough or wheeze.  No sleep meds. 1-2 cups AM coffee.  Bedtime around 9-10:30, waso 3-4 then up bet 6-7AM. CXR 02/29/20- IMPRESSION: No active cardiopulmonary disease.  Prior to Admission medications   Medication Sig Start Date End Date Taking? Authorizing Provider  allopurinol (ZYLOPRIM) 100 MG tablet TAKE ONE TABLET (100MG  TOTAL) BY MOUTH DAILY 03/28/20  Yes Jearld Fenton, NP  allopurinol (ZYLOPRIM) 300 MG tablet TAKE ONE TABLET (300MG  TOTAL) BY MOUTH DAILY 03/31/20  Yes Baity, Coralie Keens, NP  fexofenadine (ALLEGRA) 180 MG tablet Take 180 mg by mouth daily.   Yes [provider]  Fluticasone Propionate (FLONASE ALLERGY RELIEF NA) Place into the nose as needed.    Yes [provider]  lisinopril-hydrochlorothiazide (ZESTORETIC) 10-12.5 MG tablet TAKE ONE TABLET BY MOUTH DAILY 03/28/20  Yes Jearld Fenton, NP  Multiple Vitamin (MULTIVITAMIN) tablet Take 1 tablet by mouth daily.   Yes [provider]  Omega-3 Fatty Acids (FISH OIL) 1000 MG CAPS Take 1,000 mg by mouth 2 (two) times daily.   Yes [provider]  OVER THE COUNTER MEDICATION Red Yeast Rice, 1200 mg, two capsules daily.   Yes [provider]  OVER THE COUNTER MEDICATION Vitamin D 3 one capsule daily.   Yes [provider]  tadalafil (CIALIS) 20 MG tablet Take 0.5-1 tablets (10-20 mg total) by mouth daily as needed for erectile  dysfunction. 03/02/20  Yes Baity, Coralie Keens, NP  triamcinolone (NASACORT) 55 MCG/ACT AERO nasal inhaler Place 2 sprays into the nose as needed.   Yes [provider]   Past Medical History:  Diagnosis Date  . Adenomatous polyp 2008  . Allergy   . Diverticulosis   . GERD (gastroesophageal reflux disease)   . Hyperlipidemia   . Hypertension    Past Surgical History:  Procedure Laterality Date  . BIOPSY SALIVARY GLAND  2005   Dr Terence Lux  . COLONOSCOPY  2015   TA  . COLONOSCOPY W/ POLYPECTOMY  2008   adenomatous polyp, due 2013; Dr Fuller Plan  . NASAL SINUS SURGERY  2001   Dr Ernesto Rutherford  . polyp of uvula  2006   benign  . WISDOM TOOTH EXTRACTION     Family History  Problem Relation Age of Onset  . Colon cancer Father   . Hypertension Father   . Brain cancer Father   . Lung cancer Mother        former smoker  . Hypertension Mother   . Hyperlipidemia Mother   . Colon cancer Paternal Grandfather   . Heart attack Maternal Grandfather        pre 55  . Stroke Maternal Grandfather        not definitely  . Diabetes Neg Hx   . Esophageal cancer Neg Hx   . Stomach cancer Neg Hx   . Rectal cancer Neg Hx    Social History   Socioeconomic History  . Marital status: Married    Spouse name: Not on file  .  Number of children: Not on file  . Years of education: Not on file  . Highest education level: Not on file  Occupational History  . Not on file  Tobacco Use  . Smoking status: Former Smoker    Packs/day: 1.50    Years: 3.00    Pack years: 4.50    Types: Cigarettes    Start date: 04/27/1988    Quit date: 05/13/1990    Years since quitting: 29.9  . Smokeless tobacco: Former Systems developer    Quit date: 2018  . Tobacco comment: smoked 1984-1992, up to 1 ppd; chewing tobacco 1992-2002;intermittent 2003-2012 none since 2012  Vaping Use  . Vaping Use: Never used  Substance and Sexual Activity  . Alcohol use: Yes    Alcohol/week: 6.0 - 7.0 standard drinks    Types: 6 - 7 Cans of  beer per week    Comment:  occasionally  . Drug use: No  . Sexual activity: Yes  Other Topics Concern  . Not on file  Social History Narrative  . Not on file   Social Determinants of Health   Financial Resource Strain: Not on file  Food Insecurity: Not on file  Transportation Needs: Not on file  Physical Activity: Not on file  Stress: Not on file  Social Connections: Not on file  Intimate Partner Violence: Not on file   ROS-see HPI   + = positive Constitutional:    weight loss, night sweats, fevers, chills, +fatigue, lassitude. HEENT:    headaches, difficulty swallowing, tooth/dental problems, sore throat,       sneezing, itching, ear ache, nasal congestion, post nasal drip, snoring CV:    chest pain, orthopnea, PND, swelling in lower extremities, anasarca,                                  dizziness, palpitations Resp:   shortness of breath with exertion or at rest.                productive cough,   non-productive cough, coughing up of blood.              change in color of mucus.  wheezing.   Skin:    rash or lesions. GI:  No-   heartburn, indigestion, abdominal pain, nausea, vomiting, diarrhea,                 change in bowel habits, loss of appetite GU: dysuria, change in color of urine, no urgency or frequency.   flank pain. MS:   joint pain, stiffness, decreased range of motion, back pain. Neuro-     nothing unusual Psych:  change in mood or affect.  depression or anxiety.   memory loss.  OBJ- Physical Exam General- Alert, Oriented, Affect-appropriate, Distress- none acute, +obese Skin- rash-none, lesions- none, excoriation- none Lymphadenopathy- none Head- atraumatic            Eyes- Gross vision intact, PERRLA, conjunctivae and secretions clear            Ears- Hearing, canals-normal            Nose- Clear, no-Septal dev, mucus, polyps, erosion, perforation             Throat- Mallampati III-IV , mucosa clear , drainage- none, tonsils- atrophic, + teeth Neck-  flexible , trachea midline, no stridor , thyroid nl, carotid no bruit Chest - symmetrical excursion , unlabored  Heart/CV- RRR , no murmur , no gallop  , no rub, nl s1 s2                           - JVD- none , edema- none, stasis changes- none, varices- none           Lung- clear to P&A, wheeze- none, cough- none , dullness-none, rub- none           Chest wall-  Abd-  Br/ Gen/ Rectal- Not done, not indicated Extrem- cyanosis- none, clubbing, none, atrophy- none, strength- nl Neuro- grossly intact to observation

## 2020-04-27 ENCOUNTER — Encounter: Payer: Self-pay | Admitting: Internal Medicine

## 2020-04-27 ENCOUNTER — Other Ambulatory Visit: Payer: Self-pay

## 2020-04-27 ENCOUNTER — Ambulatory Visit (INDEPENDENT_AMBULATORY_CARE_PROVIDER_SITE_OTHER): Payer: PRIVATE HEALTH INSURANCE | Admitting: Internal Medicine

## 2020-04-27 VITALS — BP 130/80 | HR 87 | Temp 97.1°F | Ht 72.0 in | Wt 268.8 lb

## 2020-04-27 DIAGNOSIS — R0683 Snoring: Secondary | ICD-10-CM | POA: Diagnosis not present

## 2020-04-27 DIAGNOSIS — R0600 Dyspnea, unspecified: Secondary | ICD-10-CM | POA: Diagnosis not present

## 2020-04-27 DIAGNOSIS — E8881 Metabolic syndrome: Secondary | ICD-10-CM | POA: Diagnosis not present

## 2020-04-27 DIAGNOSIS — R0609 Other forms of dyspnea: Secondary | ICD-10-CM

## 2020-04-27 NOTE — Assessment & Plan Note (Signed)
Probable OSA. Appropriate discussion, driving caution, role of weight, sleep hygiene, medical concerns, testing, treatment options. Plan- sleep study. First choice likely CPAP

## 2020-04-27 NOTE — Assessment & Plan Note (Signed)
He describes 35 lb weight gain in recent years Plan- encourage diet, exercise

## 2020-04-27 NOTE — Assessment & Plan Note (Signed)
Not a new complaint. Former smoker. He blames weight and may be correct.  Plan- observation. Encourage more exercise

## 2020-04-27 NOTE — Patient Instructions (Signed)
Order- home sleep test    Dx snoring  Please call us for results about 2 seeks after your sleep test. If appropriate, we may be able to start treatment before we see you next.  Please call if we can help

## 2020-05-17 ENCOUNTER — Encounter: Payer: Self-pay | Admitting: Internal Medicine

## 2020-05-17 NOTE — Telephone Encounter (Signed)
Offer virtual visit if he thinks he needs more than OTC meds. Tylenol, Ibuprofen, Rest and Fluids.

## 2020-05-24 ENCOUNTER — Encounter: Payer: Self-pay | Admitting: Internal Medicine

## 2020-05-30 ENCOUNTER — Other Ambulatory Visit: Payer: Self-pay | Admitting: Internal Medicine

## 2020-06-05 ENCOUNTER — Ambulatory Visit: Payer: PRIVATE HEALTH INSURANCE | Admitting: Internal Medicine

## 2020-08-25 ENCOUNTER — Other Ambulatory Visit: Payer: Self-pay | Admitting: Internal Medicine

## 2020-12-23 ENCOUNTER — Other Ambulatory Visit: Payer: Self-pay | Admitting: Internal Medicine

## 2020-12-23 DIAGNOSIS — I10 Essential (primary) hypertension: Secondary | ICD-10-CM

## 2020-12-23 DIAGNOSIS — M1A0790 Idiopathic chronic gout, unspecified ankle and foot, without tophus (tophi): Secondary | ICD-10-CM

## 2020-12-25 NOTE — Telephone Encounter (Signed)
Patient last seen by [facility] on 02/29/2020 for CPE.   Lisinopril last refill 03/28/2020#0 with 2 rf  Allopurinol last refill 08/28/2020 #90 1 rf   No TOC appointment made with office.

## 2021-02-23 ENCOUNTER — Other Ambulatory Visit: Payer: Self-pay | Admitting: Internal Medicine

## 2021-02-24 NOTE — Telephone Encounter (Signed)
   Notes to clinic Not associated with this practice.

## 2021-03-02 NOTE — Telephone Encounter (Signed)
Patient last seen by [facility] on 02/29/2020 for CPE  No TOC appointment at this time.

## 2021-03-30 ENCOUNTER — Other Ambulatory Visit: Payer: Self-pay | Admitting: Internal Medicine

## 2021-03-30 NOTE — Telephone Encounter (Signed)
Requested medication (s) are due for refill today: Yes  Requested medication (s) are on the active medication list: Yes  Last refill:  08/28/20  Future visit scheduled: No  Notes to clinic:  pt doesn't go to [facility] for care. Please advise     Requested Prescriptions  Pending Prescriptions Disp Refills   tadalafil (CIALIS) 20 MG tablet [Pharmacy Med Name: TADALAFIL 20 MG TAB] 5 tablet 5    Sig: TAKE 0.5-1 TABLETS (10-20 MG TOTAL) BY MOUTH DAILY AS NEEDED FOR ERECTILE DYSFUNCTION     There is no refill protocol information for this order     allopurinol (ZYLOPRIM) 100 MG tablet [Pharmacy Med Name: ALLOPURINOL 100 MG TAB] 90 tablet 1    Sig: TAKE ONE TABLET (100MG  TOTAL) BY MOUTH DAILY     There is no refill protocol information for this order

## 2021-04-02 NOTE — Telephone Encounter (Signed)
Unable to reach pt by phone, left v/m for pt to cb to schedule appt for refills. Sending note to [facility] triage.

## 2021-04-02 NOTE — Telephone Encounter (Signed)
See note below; sending request to [place].

## 2021-04-03 NOTE — Telephone Encounter (Signed)
Still unable to reach pt by phone; left v/m requesting cb to schedule appt.

## 2021-04-03 NOTE — Telephone Encounter (Signed)
I spoke with [place] pts wife (DPR signed) Rhea Pink said that pts insurance has changed to [employer] is out of network. Pt plans to establish with provider at [place]. I advised to establish with provider to get further refills and [place] voiced understanding and nothing further needed.

## 2021-04-18 ENCOUNTER — Other Ambulatory Visit: Payer: Self-pay | Admitting: Internal Medicine

## 2021-06-28 ENCOUNTER — Other Ambulatory Visit: Payer: Self-pay | Admitting: Internal Medicine

## 2021-06-29 NOTE — Telephone Encounter (Signed)
° °  Notes to clinic:  Not a pt in this practice, please assess.      Requested Prescriptions  Pending Prescriptions Disp Refills   tadalafil (CIALIS) 20 MG tablet [Pharmacy Med Name: TADALAFIL 20 MG TAB] 5 tablet 5    Sig: TAKE 0.5-1 TABLETS (10-20 MG TOTAL) BY MOUTH DAILY AS NEEDED FOR ERECTILE DYSFUNCTION     There is no refill protocol information for this order

## 2021-06-29 NOTE — Telephone Encounter (Signed)
Name of Medication: Cialis 20 mg Name of Pharmacy: [place] or Written Date and Quantity: # 5 x 5 on 08/28/2020 Last Office Visit and Type: 02/29/2020 annual Next Office Visit and Type: none scheduled   Sending to [place]

## 2022-07-30 IMAGING — CT CT CARDIAC CORONARY ARTERY CALCIUM SCORE
3 series · 14 of 20 positions shown, 15 images · non-contrast
Comparison: None.
COMPARISON: None.

Addendum:
EXAM:
OVER-READ INTERPRETATION  CT CHEST

The following report is an over-read performed by radiologist Dr.
Chezlin Jaxa [REDACTED] on 03/16/2020. This
over-read does not include interpretation of cardiac or coronary
anatomy or pathology. The coronary calcium score interpretation by
the cardiologist is attached.
CLINICAL DATA: Risk stratification
Coronary Calcium Score
TECHNIQUE: The patient was scanned on a Siemens Force scanner. Axial
non-contrast 3 mm slices were carried out through the heart. The
data set was analyzed on a dedicated work station and scored using
the Agatson method.

[Series 2: casc 3.0 bv41 2 bestdiast 71 % · axial · 0.38mm/px · z∈[-330,-276]mm · 4 of 31 slices shown, 5 images]
[im 7/31  vessel]
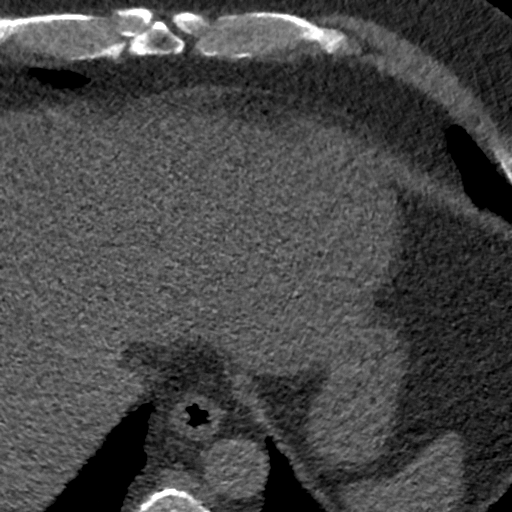
[im 7/31  lung]
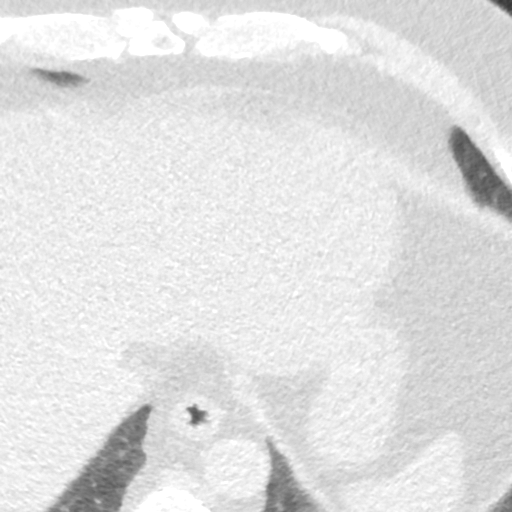
[im 13/31  vessel]
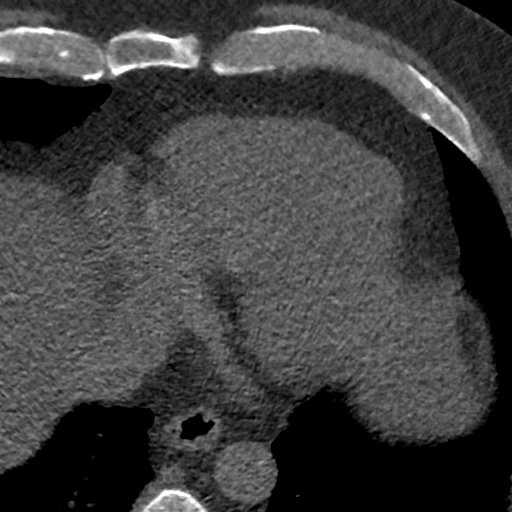
[im 19/31  vessel]
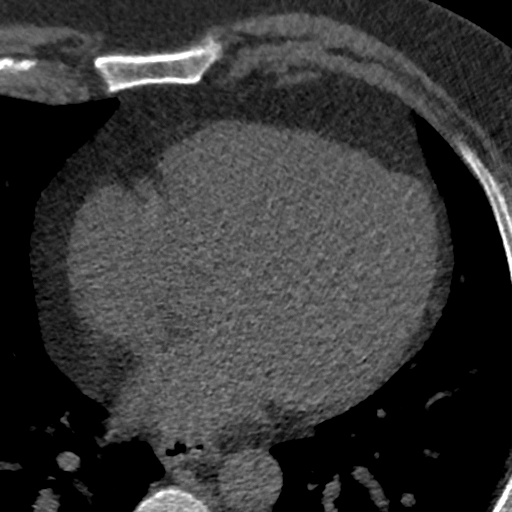
[im 25/31  vessel]
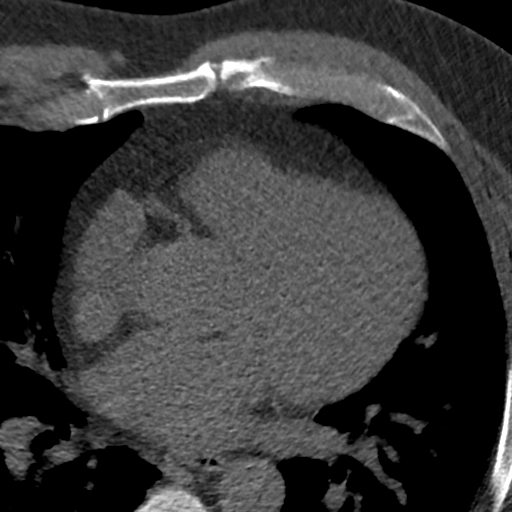

[Series 3: lung 71 % · axial · 0.68mm/px · z∈[-334,-274]mm · 5 of 31 slices shown]
[im 6/31  lung]
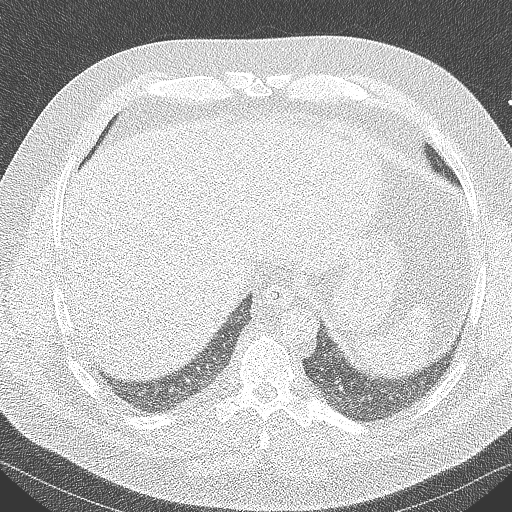
[im 11/31  lung]
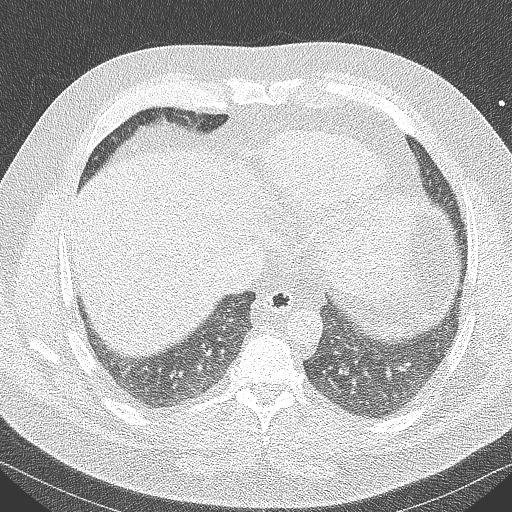
[im 16/31  lung]
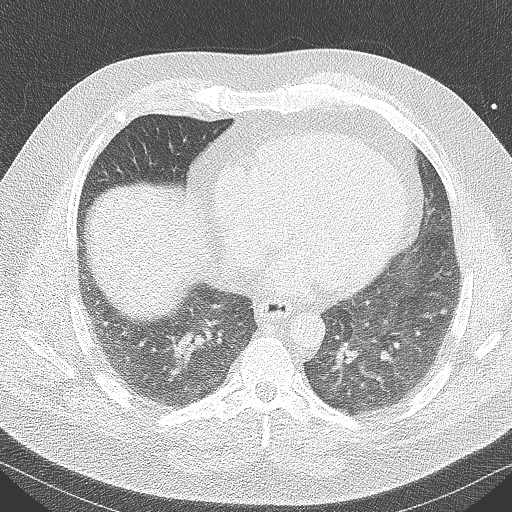
[im 21/31  lung]
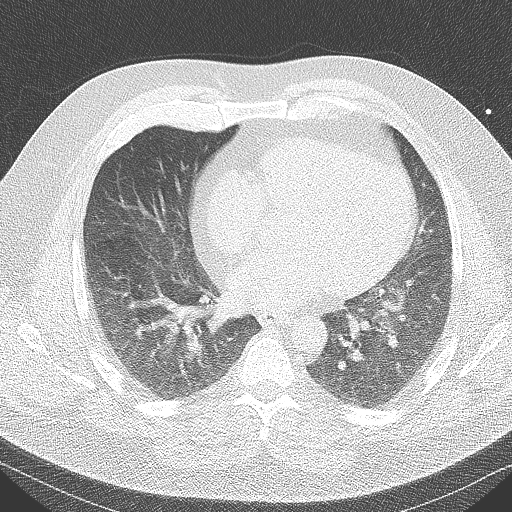
[im 26/31  lung]
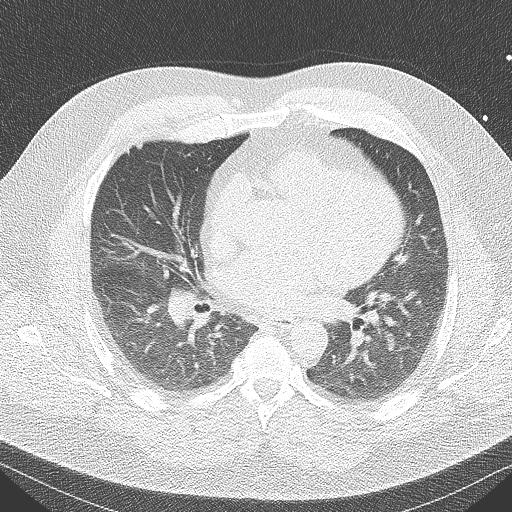

[Series 4: lung st 71 % · axial · 0.68mm/px · z∈[-334,-274]mm · 5 of 31 slices shown]
[im 6/31  lung]
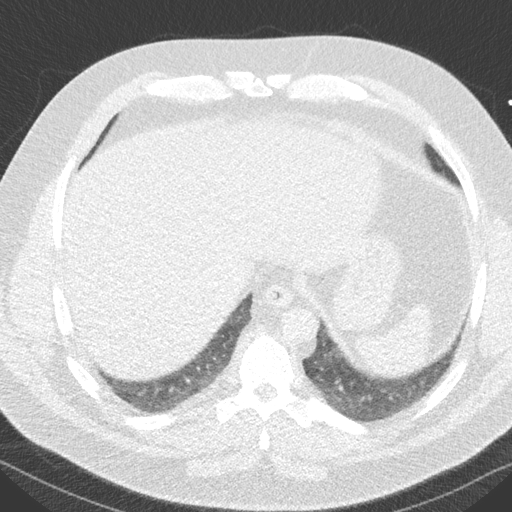
[im 11/31  lung]
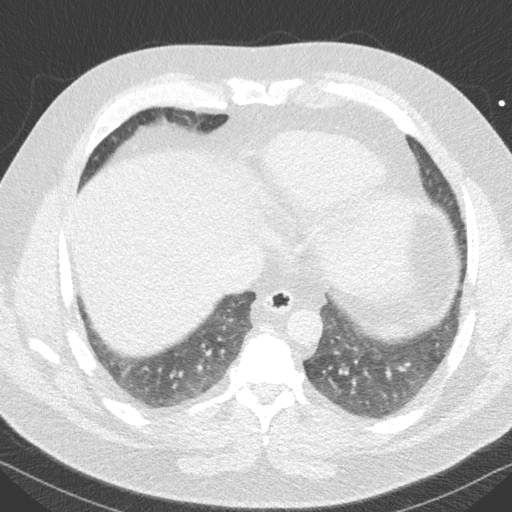
[im 16/31  lung]
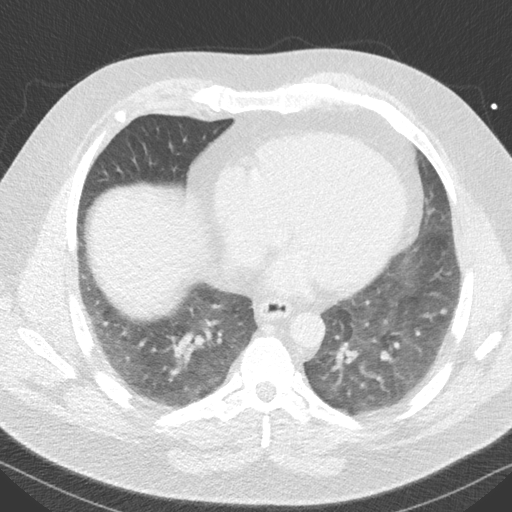
[im 21/31  lung]
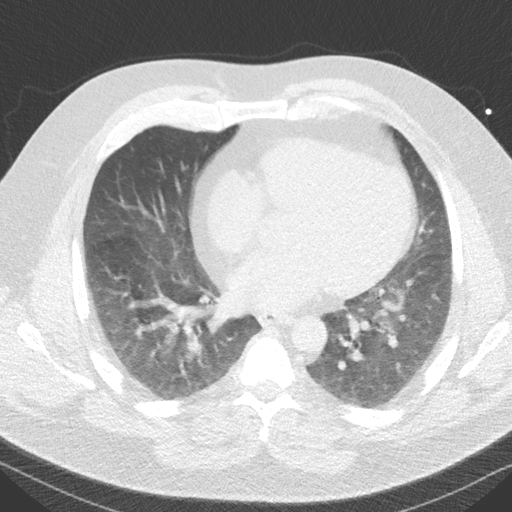
[im 26/31  lung]
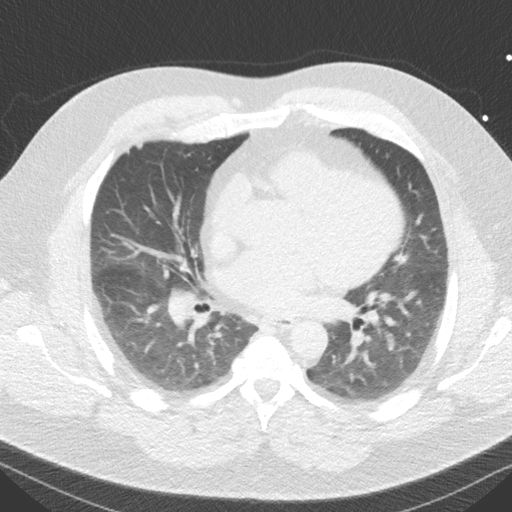

[14 of 20 positions shown; findings below may reference images not displayed]

FINDINGS: Within the visualized portions of the thorax there are no suspicious
appearing pulmonary nodules or masses, there is no acute
consolidative airspace disease, no pleural effusions, no
pneumothorax and no lymphadenopathy. Visualized portions of the
upper abdomen demonstrates mild diffuse low attenuation throughout
the visualized hepatic parenchyma. There are no aggressive appearing
lytic or blastic lesions noted in the visualized portions of the
skeleton.
IMPRESSION: 1. Hepatic steatosis.
FINDINGS: Non-cardiac: See separate report from [REDACTED].

Ascending Aorta: Normal caliber.

Pericardium: Normal.

Coronary arteries: Normal origins.
IMPRESSION: Coronary calcium score of 0.

*** End of Addendum ***
EXAM:
OVER-READ INTERPRETATION  CT CHEST

The following report is an over-read performed by radiologist Dr.
Chezlin Jaxa [REDACTED] on 03/16/2020. This
over-read does not include interpretation of cardiac or coronary
anatomy or pathology. The coronary calcium score interpretation by
the cardiologist is attached.
FINDINGS: Within the visualized portions of the thorax there are no suspicious
appearing pulmonary nodules or masses, there is no acute
consolidative airspace disease, no pleural effusions, no
pneumothorax and no lymphadenopathy. Visualized portions of the
upper abdomen demonstrates mild diffuse low attenuation throughout
the visualized hepatic parenchyma. There are no aggressive appearing
lytic or blastic lesions noted in the visualized portions of the
skeleton.
IMPRESSION: 1. Hepatic steatosis.
